# Patient Record
Sex: Male | Born: 1935 | Race: White | Hispanic: No | Marital: Married | Smoking: Never smoker
Health system: Southern US, Community
[De-identification: ages and names within clinical notes are randomized; demographics above are authoritative.]

## PROBLEM LIST (undated history)

## (undated) DIAGNOSIS — I82409 Acute embolism and thrombosis of unspecified deep veins of unspecified lower extremity: Secondary | ICD-10-CM

## (undated) DIAGNOSIS — I447 Left bundle-branch block, unspecified: Secondary | ICD-10-CM

## (undated) DIAGNOSIS — B029 Zoster without complications: Principal | ICD-10-CM

## (undated) DIAGNOSIS — K635 Polyp of colon: Secondary | ICD-10-CM

## (undated) DIAGNOSIS — I10 Essential (primary) hypertension: Secondary | ICD-10-CM

## (undated) DIAGNOSIS — Z8546 Personal history of malignant neoplasm of prostate: Secondary | ICD-10-CM

## (undated) DIAGNOSIS — J8410 Pulmonary fibrosis, unspecified: Secondary | ICD-10-CM

## (undated) DIAGNOSIS — E785 Hyperlipidemia, unspecified: Secondary | ICD-10-CM

## (undated) DIAGNOSIS — I5022 Chronic systolic (congestive) heart failure: Secondary | ICD-10-CM

## (undated) DIAGNOSIS — I8390 Asymptomatic varicose veins of unspecified lower extremity: Secondary | ICD-10-CM

## (undated) DIAGNOSIS — K5730 Diverticulosis of large intestine without perforation or abscess without bleeding: Secondary | ICD-10-CM

## (undated) DIAGNOSIS — C61 Malignant neoplasm of prostate: Secondary | ICD-10-CM

## (undated) HISTORY — DX: Zoster without complications: B02.9

## (undated) HISTORY — DX: Chronic systolic (congestive) heart failure: I50.22

## (undated) HISTORY — DX: Acute embolism and thrombosis of unspecified deep veins of unspecified lower extremity: I82.409

## (undated) HISTORY — PX: CERVICAL LAMINECTOMY: SHX94

## (undated) HISTORY — PX: APPENDECTOMY: SHX54

## (undated) HISTORY — DX: Hyperlipidemia, unspecified: E78.5

## (undated) HISTORY — DX: Essential (primary) hypertension: I10

## (undated) HISTORY — DX: Left bundle-branch block, unspecified: I44.7

## (undated) HISTORY — DX: Diverticulosis of large intestine without perforation or abscess without bleeding: K57.30

## (undated) HISTORY — PX: TOTAL HIP ARTHROPLASTY: SHX124

## (undated) HISTORY — DX: Pulmonary fibrosis, unspecified: J84.10

## (undated) HISTORY — DX: Asymptomatic varicose veins of unspecified lower extremity: I83.90

## (undated) HISTORY — DX: Malignant neoplasm of prostate: C61

## (undated) HISTORY — DX: Personal history of malignant neoplasm of prostate: Z85.46

## (undated) HISTORY — DX: Polyp of colon: K63.5

---

## 2000-06-20 ENCOUNTER — Encounter: Payer: Self-pay | Admitting: Orthopedic Surgery

## 2000-06-20 ENCOUNTER — Ambulatory Visit (HOSPITAL_COMMUNITY): Admission: RE | Admit: 2000-06-20 | Discharge: 2000-06-20 | Payer: Self-pay | Admitting: Orthopedic Surgery

## 2000-07-10 ENCOUNTER — Encounter: Payer: Self-pay | Admitting: Orthopedic Surgery

## 2000-07-14 ENCOUNTER — Inpatient Hospital Stay (HOSPITAL_COMMUNITY): Admission: RE | Admit: 2000-07-14 | Discharge: 2000-07-18 | Payer: Self-pay | Admitting: Orthopedic Surgery

## 2000-07-14 ENCOUNTER — Encounter: Payer: Self-pay | Admitting: Orthopedic Surgery

## 2002-12-22 ENCOUNTER — Encounter: Payer: Self-pay | Admitting: Internal Medicine

## 2003-11-09 ENCOUNTER — Encounter: Payer: Self-pay | Admitting: Cardiovascular Disease

## 2003-11-09 ENCOUNTER — Encounter: Payer: Self-pay | Admitting: Internal Medicine

## 2003-11-09 ENCOUNTER — Observation Stay (HOSPITAL_COMMUNITY): Admission: EM | Admit: 2003-11-09 | Discharge: 2003-11-10 | Payer: Self-pay | Admitting: Emergency Medicine

## 2003-11-11 ENCOUNTER — Encounter: Payer: Self-pay | Admitting: Internal Medicine

## 2003-12-02 ENCOUNTER — Inpatient Hospital Stay (HOSPITAL_BASED_OUTPATIENT_CLINIC_OR_DEPARTMENT_OTHER): Admission: RE | Admit: 2003-12-02 | Discharge: 2003-12-02 | Payer: Self-pay | Admitting: Cardiology

## 2004-02-24 ENCOUNTER — Ambulatory Visit: Payer: Self-pay | Admitting: Cardiovascular Disease

## 2004-03-22 ENCOUNTER — Ambulatory Visit: Payer: Self-pay | Admitting: Cardiology

## 2004-05-03 ENCOUNTER — Ambulatory Visit: Payer: Self-pay | Admitting: Cardiology

## 2004-05-22 ENCOUNTER — Ambulatory Visit: Payer: Self-pay | Admitting: Cardiology

## 2004-06-04 ENCOUNTER — Ambulatory Visit: Payer: Self-pay | Admitting: Internal Medicine

## 2004-06-05 ENCOUNTER — Observation Stay (HOSPITAL_COMMUNITY): Admission: EM | Admit: 2004-06-05 | Discharge: 2004-06-05 | Payer: Self-pay | Admitting: Emergency Medicine

## 2004-06-11 ENCOUNTER — Ambulatory Visit: Payer: Self-pay | Admitting: Cardiology

## 2004-06-12 ENCOUNTER — Ambulatory Visit: Payer: Self-pay | Admitting: Internal Medicine

## 2004-06-14 ENCOUNTER — Encounter: Admission: RE | Admit: 2004-06-14 | Discharge: 2004-06-14 | Payer: Self-pay | Admitting: Internal Medicine

## 2004-06-26 ENCOUNTER — Ambulatory Visit: Payer: Self-pay | Admitting: Cardiology

## 2004-06-26 ENCOUNTER — Encounter: Admission: RE | Admit: 2004-06-26 | Discharge: 2004-06-26 | Payer: Self-pay | Admitting: Internal Medicine

## 2004-08-16 ENCOUNTER — Ambulatory Visit: Payer: Self-pay | Admitting: Cardiology

## 2004-08-21 ENCOUNTER — Inpatient Hospital Stay (HOSPITAL_COMMUNITY): Admission: RE | Admit: 2004-08-21 | Discharge: 2004-08-22 | Payer: Self-pay | Admitting: Neurosurgery

## 2005-02-07 ENCOUNTER — Ambulatory Visit: Payer: Self-pay | Admitting: Cardiology

## 2005-03-11 ENCOUNTER — Ambulatory Visit: Payer: Self-pay | Admitting: Internal Medicine

## 2005-03-13 ENCOUNTER — Ambulatory Visit: Payer: Self-pay

## 2005-03-13 ENCOUNTER — Encounter: Payer: Self-pay | Admitting: Internal Medicine

## 2005-05-06 ENCOUNTER — Ambulatory Visit: Payer: Self-pay | Admitting: Cardiology

## 2005-05-09 ENCOUNTER — Ambulatory Visit: Payer: Self-pay | Admitting: Internal Medicine

## 2005-05-13 ENCOUNTER — Ambulatory Visit: Payer: Self-pay | Admitting: Internal Medicine

## 2005-05-27 ENCOUNTER — Ambulatory Visit: Payer: Self-pay | Admitting: Internal Medicine

## 2005-07-05 ENCOUNTER — Ambulatory Visit: Payer: Self-pay | Admitting: Internal Medicine

## 2005-08-16 ENCOUNTER — Ambulatory Visit: Payer: Self-pay | Admitting: Internal Medicine

## 2005-10-11 ENCOUNTER — Ambulatory Visit: Payer: Self-pay | Admitting: Internal Medicine

## 2005-10-29 ENCOUNTER — Ambulatory Visit: Payer: Self-pay | Admitting: *Deleted

## 2005-12-12 ENCOUNTER — Ambulatory Visit: Payer: Self-pay | Admitting: Cardiology

## 2005-12-30 ENCOUNTER — Encounter: Payer: Self-pay | Admitting: Cardiology

## 2005-12-30 ENCOUNTER — Ambulatory Visit: Payer: Self-pay

## 2006-02-28 ENCOUNTER — Ambulatory Visit: Payer: Self-pay | Admitting: Internal Medicine

## 2006-02-28 LAB — CONVERTED CEMR LAB
BUN: 36 mg/dL — ABNORMAL HIGH (ref 6–23)
CO2: 26 meq/L (ref 19–32)
Calcium: 9.3 mg/dL (ref 8.4–10.5)
Chloride: 104 meq/L (ref 96–112)
Creatinine, Ser: 1.3 mg/dL (ref 0.4–1.5)
GFR calc non Af Amer: 58 mL/min
Glomerular Filtration Rate, Af Am: 70 mL/min/{1.73_m2}
Glucose, Bld: 100 mg/dL — ABNORMAL HIGH (ref 70–99)
Potassium: 5.4 meq/L — ABNORMAL HIGH (ref 3.5–5.1)
Sodium: 136 meq/L (ref 135–145)

## 2006-03-10 ENCOUNTER — Ambulatory Visit: Payer: Self-pay | Admitting: Cardiology

## 2006-03-18 ENCOUNTER — Encounter: Admission: RE | Admit: 2006-03-18 | Discharge: 2006-06-16 | Payer: Self-pay | Admitting: Internal Medicine

## 2006-04-22 DIAGNOSIS — C61 Malignant neoplasm of prostate: Secondary | ICD-10-CM

## 2006-04-22 HISTORY — DX: Malignant neoplasm of prostate: C61

## 2006-05-26 ENCOUNTER — Ambulatory Visit: Payer: Self-pay | Admitting: Internal Medicine

## 2006-05-26 LAB — CONVERTED CEMR LAB
ALT: 40 units/L (ref 0–40)
AST: 35 units/L (ref 0–37)
Albumin: 3.9 g/dL (ref 3.5–5.2)
Alkaline Phosphatase: 59 units/L (ref 39–117)
BUN: 24 mg/dL — ABNORMAL HIGH (ref 6–23)
Basophils Absolute: 0.1 10*3/uL (ref 0.0–0.1)
Basophils Relative: 1.2 % — ABNORMAL HIGH (ref 0.0–1.0)
Bilirubin, Direct: 0.1 mg/dL (ref 0.0–0.3)
CO2: 29 meq/L (ref 19–32)
Calcium: 9.5 mg/dL (ref 8.4–10.5)
Chloride: 106 meq/L (ref 96–112)
Cholesterol: 172 mg/dL (ref 0–200)
Creatinine, Ser: 1.2 mg/dL (ref 0.4–1.5)
Direct LDL: 86.5 mg/dL
Eosinophils Absolute: 0.3 10*3/uL (ref 0.0–0.6)
Eosinophils Relative: 5.9 % — ABNORMAL HIGH (ref 0.0–5.0)
GFR calc Af Amer: 76 mL/min
GFR calc non Af Amer: 63 mL/min
Glucose, Bld: 100 mg/dL — ABNORMAL HIGH (ref 70–99)
HCT: 38.3 % — ABNORMAL LOW (ref 39.0–52.0)
HDL: 48.6 mg/dL (ref >39.0)
Hemoglobin: 13.4 g/dL (ref 13.0–17.0)
Lymphocytes Relative: 30.9 % (ref 12.0–46.0)
MCHC: 35 g/dL (ref 30.0–36.0)
MCV: 93.4 fL (ref 78.0–100.0)
Monocytes Absolute: 0.8 10*3/uL — ABNORMAL HIGH (ref 0.2–0.7)
Monocytes Relative: 13.3 % — ABNORMAL HIGH (ref 3.0–11.0)
Neutro Abs: 2.9 10*3/uL (ref 1.4–7.7)
Neutrophils Relative %: 48.7 % (ref 43.0–77.0)
PSA: 1.27 ng/mL (ref 0.10–4.00)
Platelets: 227 10*3/uL (ref 150–400)
Potassium: 4.6 meq/L (ref 3.5–5.1)
RBC: 4.1 M/uL — ABNORMAL LOW (ref 4.22–5.81)
RDW: 12.7 % (ref 11.5–14.6)
Sodium: 139 meq/L (ref 135–145)
TSH: 1.85 microintl units/mL (ref 0.35–5.50)
Total Bilirubin: 0.5 mg/dL (ref 0.3–1.2)
Total CHOL/HDL Ratio: 3.5
Total Protein: 7.3 g/dL (ref 6.0–8.3)
Triglycerides: 257 mg/dL (ref 0–149)
VLDL: 51 mg/dL — ABNORMAL HIGH (ref 0–40)
WBC: 5.9 10*3/uL (ref 4.5–10.5)

## 2006-06-10 IMAGING — CR DG CHEST 2V
2 series · 2 of 2 positions shown · non-contrast
Comparison: none

CLINICAL DATA: 73-year-old with syncopal episode. 
 CHEST ? 2 VIEW:
 Two views of the chest with prior films from 11/09/03.  The cardiac silhouette is upper limits of normal with mild left ventricular configuration.  The right hilum is quite prominent, but this is a stable finding.  The patient had a recent chest CT and looks to be a combination of prominent right pulmonary artery, SVC, and ascending aorta.  Low lung volumes with chronic interstitial change.  No edema, infiltrates, or effusions.

[view not recorded (1 of 2)]
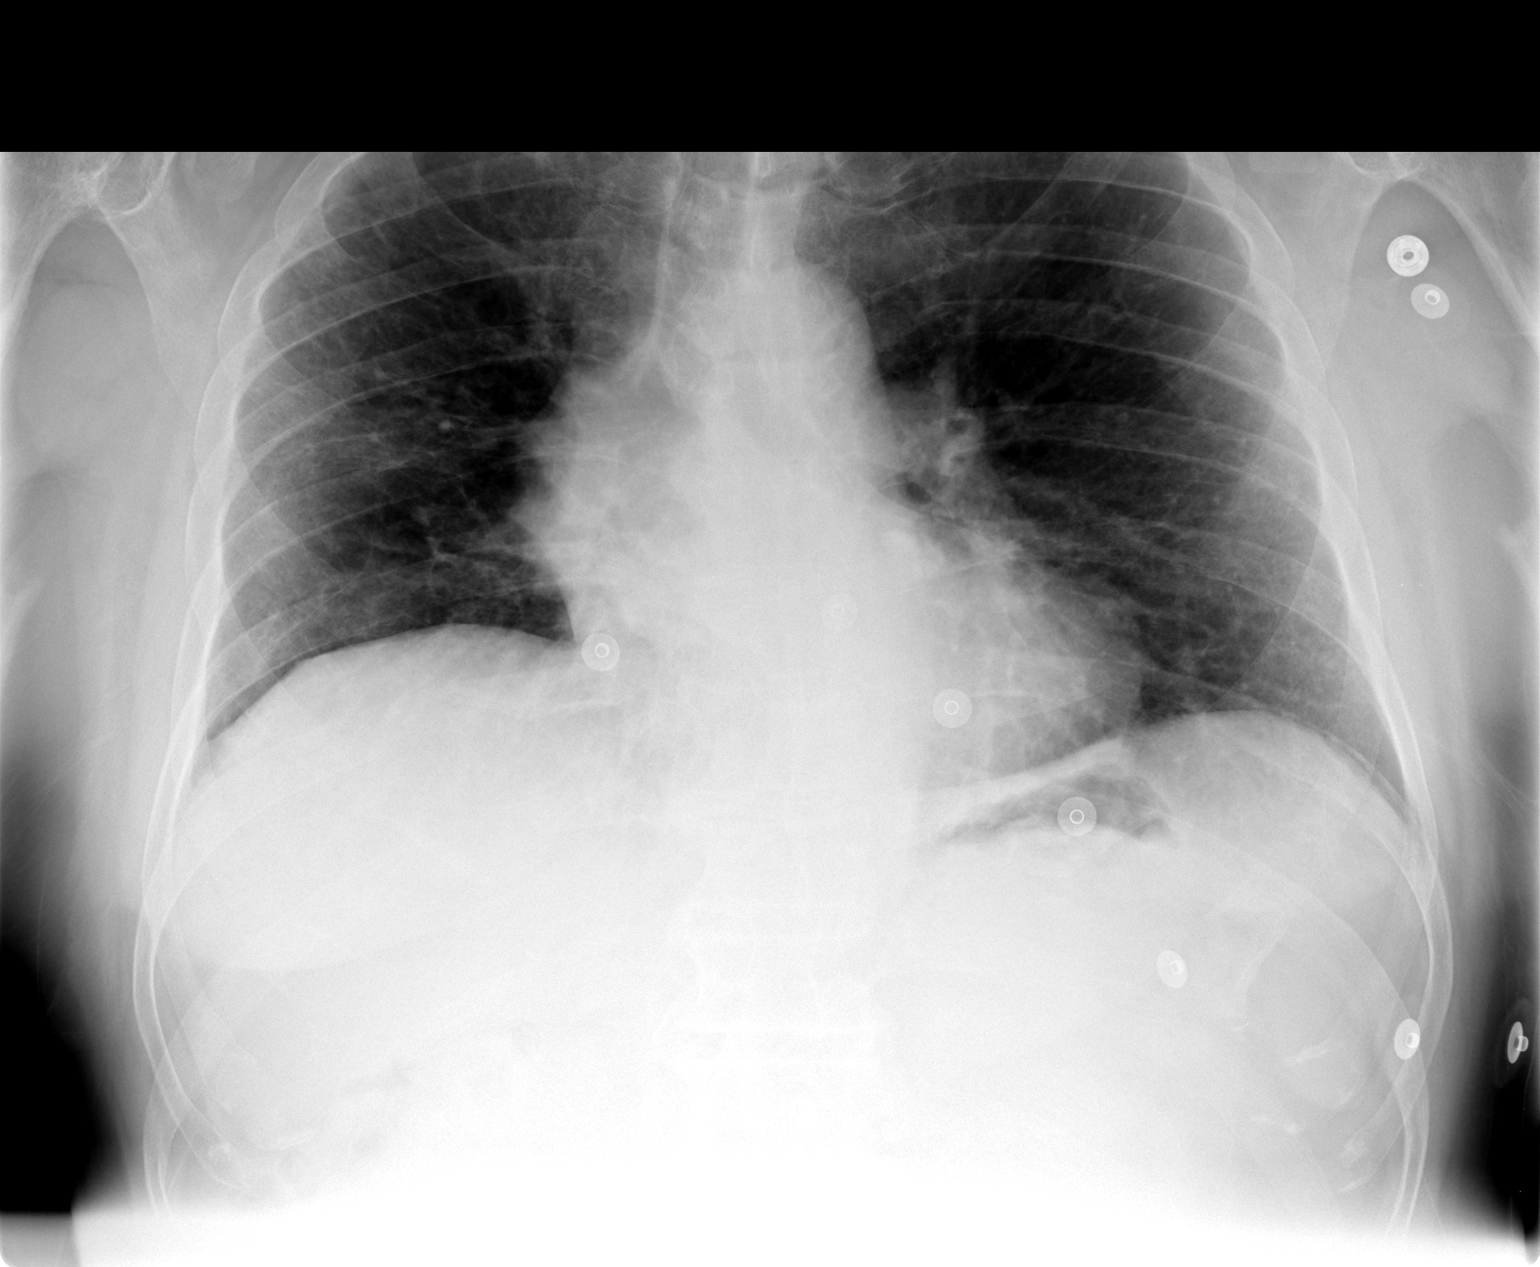

[view not recorded (2 of 2)]
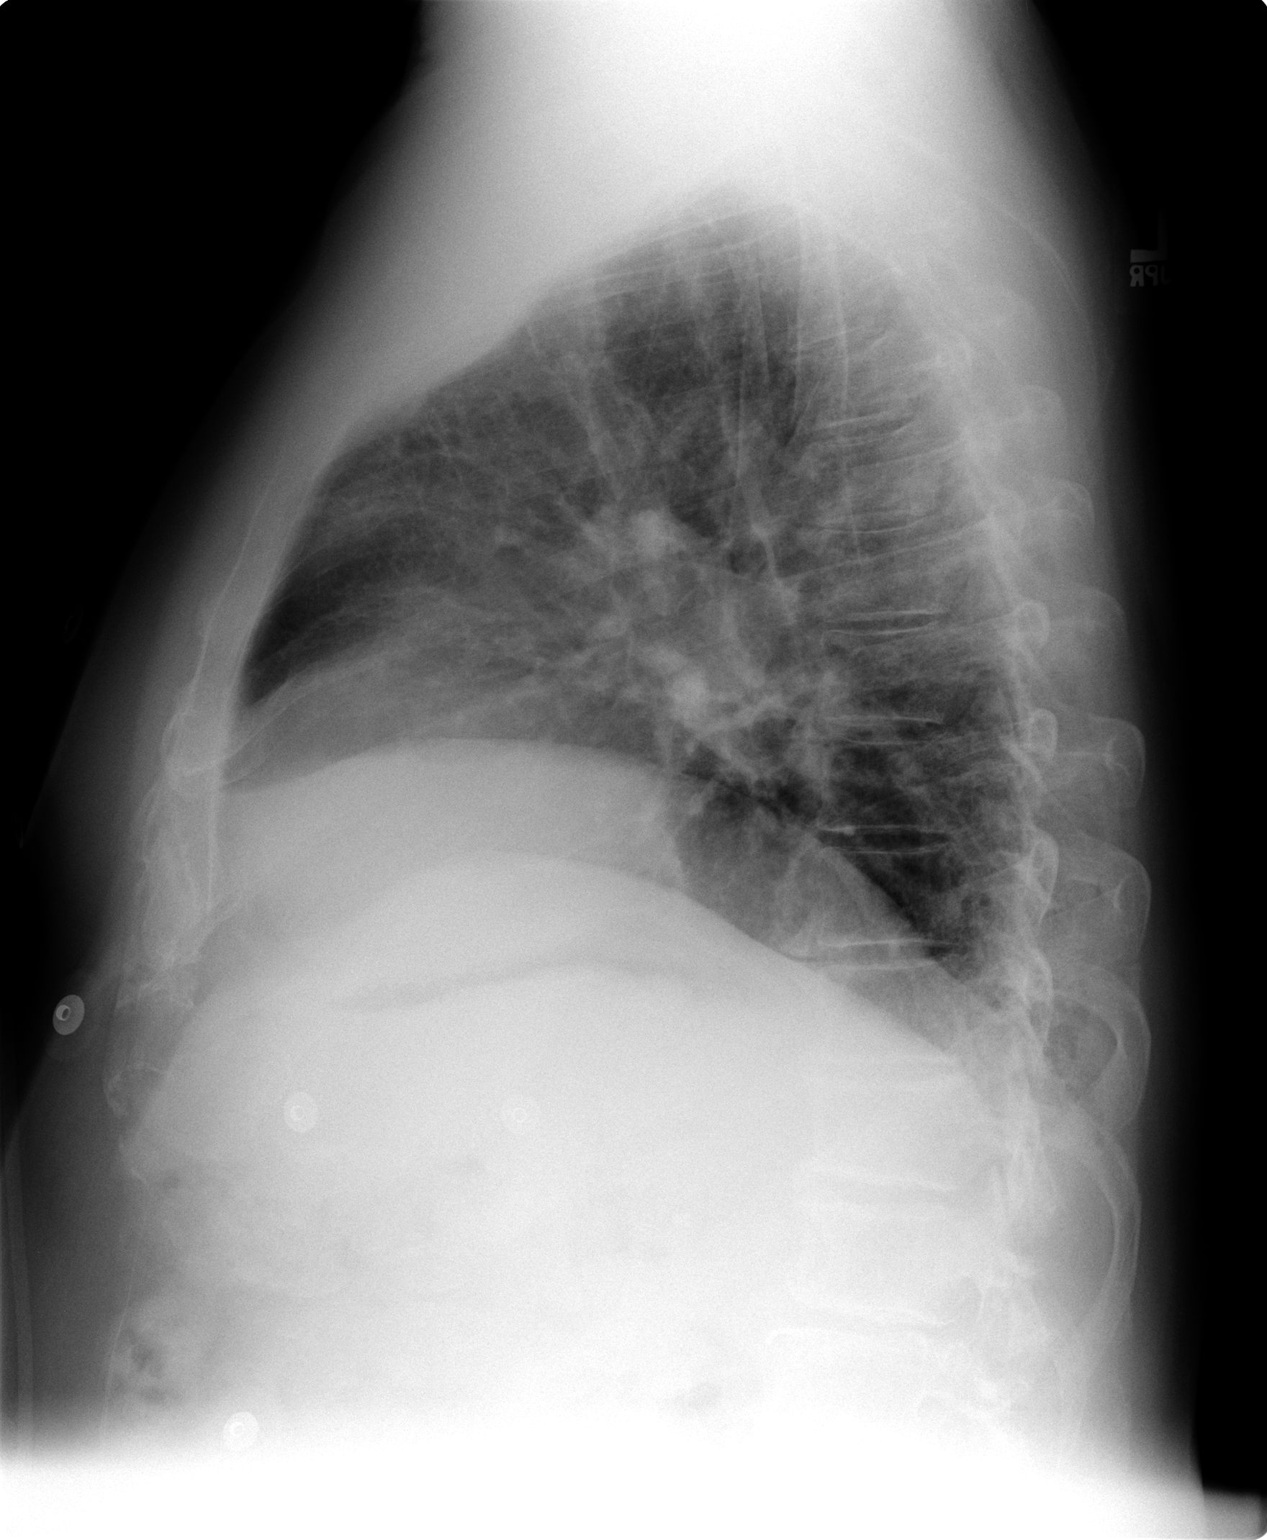

[2 of 2 positions shown; findings below may reference images not displayed]

IMPRESSION: 1.  Stable heart size and stable prominent right hilar contour as described above.  
 2.  Chronic interstitial changes, but no definite acute overlying pulmonary process.

## 2006-06-18 ENCOUNTER — Encounter: Payer: Self-pay | Admitting: Internal Medicine

## 2006-06-19 ENCOUNTER — Ambulatory Visit: Payer: Self-pay | Admitting: Internal Medicine

## 2006-07-01 ENCOUNTER — Ambulatory Visit: Payer: Self-pay | Admitting: Internal Medicine

## 2006-07-01 ENCOUNTER — Encounter: Payer: Self-pay | Admitting: Internal Medicine

## 2006-07-01 ENCOUNTER — Encounter (INDEPENDENT_AMBULATORY_CARE_PROVIDER_SITE_OTHER): Payer: Self-pay | Admitting: Specialist

## 2006-07-08 ENCOUNTER — Ambulatory Visit: Admission: RE | Admit: 2006-07-08 | Discharge: 2006-10-06 | Payer: Self-pay | Admitting: Radiation Oncology

## 2006-07-21 ENCOUNTER — Encounter: Admission: RE | Admit: 2006-07-21 | Discharge: 2006-07-21 | Payer: Self-pay | Admitting: Urology

## 2006-08-25 ENCOUNTER — Ambulatory Visit (HOSPITAL_BASED_OUTPATIENT_CLINIC_OR_DEPARTMENT_OTHER): Admission: RE | Admit: 2006-08-25 | Discharge: 2006-08-25 | Payer: Self-pay | Admitting: Urology

## 2006-09-29 ENCOUNTER — Encounter: Payer: Self-pay | Admitting: Internal Medicine

## 2006-11-14 ENCOUNTER — Encounter: Payer: Self-pay | Admitting: Internal Medicine

## 2006-12-01 ENCOUNTER — Ambulatory Visit: Payer: Self-pay | Admitting: Cardiology

## 2006-12-25 ENCOUNTER — Ambulatory Visit: Payer: Self-pay | Admitting: Internal Medicine

## 2007-02-20 ENCOUNTER — Ambulatory Visit: Payer: Self-pay | Admitting: Internal Medicine

## 2007-05-25 ENCOUNTER — Encounter: Payer: Self-pay | Admitting: Internal Medicine

## 2007-05-26 ENCOUNTER — Ambulatory Visit: Payer: Self-pay | Admitting: Internal Medicine

## 2007-05-26 DIAGNOSIS — I868 Varicose veins of other specified sites: Secondary | ICD-10-CM

## 2007-05-26 DIAGNOSIS — I447 Left bundle-branch block, unspecified: Secondary | ICD-10-CM

## 2007-05-26 DIAGNOSIS — E785 Hyperlipidemia, unspecified: Secondary | ICD-10-CM

## 2007-05-26 DIAGNOSIS — I10 Essential (primary) hypertension: Secondary | ICD-10-CM

## 2007-05-26 DIAGNOSIS — Z8546 Personal history of malignant neoplasm of prostate: Secondary | ICD-10-CM | POA: Insufficient documentation

## 2007-05-26 LAB — CONVERTED CEMR LAB
ALT: 38 units/L (ref 0–53)
Albumin: 4 g/dL (ref 3.5–5.2)
Alkaline Phosphatase: 50 units/L (ref 39–117)
BUN: 27 mg/dL — ABNORMAL HIGH (ref 6–23)
Bilirubin, Direct: 0.2 mg/dL (ref 0.0–0.3)
Calcium: 9.7 mg/dL (ref 8.4–10.5)
Cholesterol: 180 mg/dL (ref 0–200)
Eosinophils Relative: 5.4 % — ABNORMAL HIGH (ref 0.0–5.0)
Glucose, Bld: 93 mg/dL (ref 70–99)
HCT: 38.7 % — ABNORMAL LOW (ref 39.0–52.0)
Hemoglobin: 12.9 g/dL — ABNORMAL LOW (ref 13.0–17.0)
Lymphocytes Relative: 24.4 % (ref 12.0–46.0)
Neutrophils Relative %: 57.9 % (ref 43.0–77.0)
Platelets: 213 10*3/uL (ref 150–400)
RDW: 12.7 % (ref 11.5–14.6)
Sodium: 140 meq/L (ref 135–145)
Total Bilirubin: 0.8 mg/dL (ref 0.3–1.2)
Triglycerides: 238 mg/dL (ref 0–149)

## 2007-05-29 DIAGNOSIS — Z86718 Personal history of other venous thrombosis and embolism: Secondary | ICD-10-CM

## 2007-05-29 DIAGNOSIS — I509 Heart failure, unspecified: Secondary | ICD-10-CM

## 2007-07-17 DIAGNOSIS — D126 Benign neoplasm of colon, unspecified: Secondary | ICD-10-CM | POA: Insufficient documentation

## 2007-07-17 DIAGNOSIS — K5730 Diverticulosis of large intestine without perforation or abscess without bleeding: Secondary | ICD-10-CM | POA: Insufficient documentation

## 2007-08-14 ENCOUNTER — Ambulatory Visit (HOSPITAL_COMMUNITY): Admission: EM | Admit: 2007-08-14 | Discharge: 2007-08-14 | Payer: Self-pay | Admitting: Emergency Medicine

## 2007-08-25 ENCOUNTER — Encounter: Payer: Self-pay | Admitting: Internal Medicine

## 2007-09-29 ENCOUNTER — Ambulatory Visit: Payer: Self-pay | Admitting: Internal Medicine

## 2007-10-01 LAB — CONVERTED CEMR LAB
ALT: 30 units/L (ref 0–53)
AST: 29 units/L (ref 0–37)
Alkaline Phosphatase: 64 units/L (ref 39–117)
HDL: 39.2 mg/dL (ref >39.0)
Total CHOL/HDL Ratio: 7
Total Protein: 6.7 g/dL (ref 6.0–8.3)
Triglycerides: 234 mg/dL (ref 0–149)
VLDL: 47 mg/dL — ABNORMAL HIGH (ref 0–40)

## 2007-10-07 ENCOUNTER — Ambulatory Visit: Payer: Self-pay | Admitting: Internal Medicine

## 2007-12-10 ENCOUNTER — Ambulatory Visit: Payer: Self-pay | Admitting: Cardiology

## 2007-12-23 ENCOUNTER — Encounter: Payer: Self-pay | Admitting: Internal Medicine

## 2008-02-10 ENCOUNTER — Telehealth: Payer: Self-pay | Admitting: Internal Medicine

## 2008-03-23 ENCOUNTER — Inpatient Hospital Stay (HOSPITAL_COMMUNITY): Admission: RE | Admit: 2008-03-23 | Discharge: 2008-03-26 | Payer: Self-pay | Admitting: Orthopedic Surgery

## 2008-04-27 ENCOUNTER — Encounter: Payer: Self-pay | Admitting: Internal Medicine

## 2008-08-30 ENCOUNTER — Encounter: Payer: Self-pay | Admitting: Internal Medicine

## 2008-10-07 ENCOUNTER — Encounter (INDEPENDENT_AMBULATORY_CARE_PROVIDER_SITE_OTHER): Payer: Self-pay | Admitting: *Deleted

## 2008-10-31 ENCOUNTER — Ambulatory Visit: Payer: Self-pay | Admitting: Internal Medicine

## 2008-11-01 LAB — CONVERTED CEMR LAB
ALT: 38 units/L (ref 0–53)
AST: 35 units/L (ref 0–37)
Albumin: 3.7 g/dL (ref 3.5–5.2)
Alkaline Phosphatase: 49 units/L (ref 39–117)
BUN: 18 mg/dL (ref 6–23)
Basophils Absolute: 0 10*3/uL (ref 0.0–0.1)
Bilirubin, Direct: 0.1 mg/dL (ref 0.0–0.3)
Cholesterol: 166 mg/dL (ref 0–200)
GFR calc non Af Amer: 77.28 mL/min (ref >60)
Glucose, Bld: 97 mg/dL (ref 70–99)
Hemoglobin: 13.7 g/dL (ref 13.0–17.0)
LDL Cholesterol: 89 mg/dL (ref 0–99)
Lymphs Abs: 1.7 10*3/uL (ref 0.7–4.0)
MCHC: 34.8 g/dL (ref 30.0–36.0)
MCV: 94.8 fL (ref 78.0–100.0)
Monocytes Absolute: 0.8 10*3/uL (ref 0.1–1.0)
Monocytes Relative: 13.4 % — ABNORMAL HIGH (ref 3.0–12.0)
Platelets: 204 10*3/uL (ref 150.0–400.0)
Sodium: 142 meq/L (ref 135–145)
TSH: 1.81 microintl units/mL (ref 0.35–5.50)
Total Bilirubin: 0.6 mg/dL (ref 0.3–1.2)
Total Protein: 7.4 g/dL (ref 6.0–8.3)

## 2008-12-20 ENCOUNTER — Ambulatory Visit: Payer: Self-pay | Admitting: Cardiology

## 2009-02-09 ENCOUNTER — Ambulatory Visit: Payer: Self-pay | Admitting: Internal Medicine

## 2009-03-22 ENCOUNTER — Encounter (INDEPENDENT_AMBULATORY_CARE_PROVIDER_SITE_OTHER): Payer: Self-pay | Admitting: *Deleted

## 2009-08-16 ENCOUNTER — Ambulatory Visit: Payer: Self-pay | Admitting: Internal Medicine

## 2009-08-17 LAB — CONVERTED CEMR LAB
ALT: 37 units/L (ref 0–53)
AST: 37 units/L (ref 0–37)
Albumin: 3.9 g/dL (ref 3.5–5.2)
Alkaline Phosphatase: 49 units/L (ref 39–117)
Basophils Absolute: 0 10*3/uL (ref 0.0–0.1)
Bilirubin, Direct: 0 mg/dL (ref 0.0–0.3)
Calcium: 9.3 mg/dL (ref 8.4–10.5)
Eosinophils Absolute: 0.4 10*3/uL (ref 0.0–0.7)
Eosinophils Relative: 6.9 % — ABNORMAL HIGH (ref 0.0–5.0)
HDL: 58.5 mg/dL (ref >39.00)
LDL Cholesterol: 59 mg/dL (ref 0–99)
Lymphs Abs: 1.5 10*3/uL (ref 0.7–4.0)
MCHC: 34.1 g/dL (ref 30.0–36.0)
Monocytes Relative: 14.4 % — ABNORMAL HIGH (ref 3.0–12.0)
Neutro Abs: 2.5 10*3/uL (ref 1.4–7.7)
Neutrophils Relative %: 48.8 % (ref 43.0–77.0)
RBC: 4.25 M/uL (ref 4.22–5.81)
RDW: 14.3 % (ref 11.5–14.6)
Sodium: 142 meq/L (ref 135–145)
TSH: 2.36 microintl units/mL (ref 0.35–5.50)
Triglycerides: 166 mg/dL — ABNORMAL HIGH (ref 0.0–149.0)
WBC: 5.2 10*3/uL (ref 4.5–10.5)

## 2009-09-11 ENCOUNTER — Telehealth (INDEPENDENT_AMBULATORY_CARE_PROVIDER_SITE_OTHER): Payer: Self-pay | Admitting: *Deleted

## 2009-11-27 ENCOUNTER — Encounter: Payer: Self-pay | Admitting: Internal Medicine

## 2009-11-27 ENCOUNTER — Encounter: Payer: Self-pay | Admitting: Cardiology

## 2010-01-02 ENCOUNTER — Encounter: Payer: Self-pay | Admitting: Cardiology

## 2010-01-02 ENCOUNTER — Ambulatory Visit: Payer: Self-pay

## 2010-01-02 DIAGNOSIS — R930 Abnormal findings on diagnostic imaging of skull and head, not elsewhere classified: Secondary | ICD-10-CM | POA: Insufficient documentation

## 2010-01-03 ENCOUNTER — Ambulatory Visit: Payer: Self-pay | Admitting: Cardiology

## 2010-01-03 DIAGNOSIS — R0989 Other specified symptoms and signs involving the circulatory and respiratory systems: Secondary | ICD-10-CM | POA: Insufficient documentation

## 2010-01-10 ENCOUNTER — Telehealth: Payer: Self-pay | Admitting: Cardiology

## 2010-01-30 ENCOUNTER — Ambulatory Visit: Payer: Self-pay | Admitting: Internal Medicine

## 2010-05-22 NOTE — Letter (Signed)
Summary: [facility]  [facility]   Imported By: Maryln Gottron 12/06/2009 12:52:04  _____________________________________________________________________  External Attachment:    Type:   Image     Comment:   External Document

## 2010-05-22 NOTE — Progress Notes (Signed)
Summary: Test results   Phone Note Call from Patient Call back at Home Phone [phone]   Caller: Patient Reason for Call: Lab or Test Results Initial call taken by: Judie Grieve,  January 10, 2010 10:24 AM  Follow-up for Phone Call        adv pt of cxr results. Claris Gladden, RN, BSN

## 2010-05-22 NOTE — Miscellaneous (Signed)
Summary: Orders Update  CXR order  Clinical Lists Changes  Problems: Added new problem of ABNORMAL BREATH SOUNDS (ICD-786.7) Orders: Added new Test order of T-2 View CXR (71020TC) - Signed

## 2010-05-22 NOTE — Assessment & Plan Note (Signed)
Summary: FLU SHOT // RS   Nurse Visit   Allergies: 1)  ! Uroxatral (Alfuzosin Hcl) 2)  ! Diovan (Valsartan)  Orders Added: 1)  Flu Vaccine 18yrs + MEDICARE PATIENTS [Q2039] 2)  Administration Flu vaccine - MCR [G0008] Flu Vaccine Consent Questions     Do you have a history of severe allergic reactions to this vaccine? no    Any prior history of allergic reactions to egg and/or gelatin? no    Do you have a sensitivity to the preservative Thimersol? no    Do you have a past history of Guillan-Barre Syndrome? no    Do you currently have an acute febrile illness? no    Have you ever had a severe reaction to latex? no    Vaccine information given and explained to patient? yes    Are you currently pregnant? no    Lot Number:[id number]   Exp Date:10/20/2010   Site Given  Left Deltoid IM .lbmedflu

## 2010-05-22 NOTE — Progress Notes (Signed)
    Walk in Patient Form Recieved " pt needs smaller capsule" sent to [facility]  Sep 11, 2009 11:13 AM

## 2010-05-22 NOTE — Letter (Signed)
Summary: [facility]  [facility]   Imported By: Marylou Mccoy 12/14/2009 11:25:41  _____________________________________________________________________  External Attachment:    Type:   Image     Comment:   External Document

## 2010-05-22 NOTE — Assessment & Plan Note (Signed)
Summary: CPX/PT FASTING/RCD   Vital Signs:  Patient profile:   77 year old male Height:      69.75 inches Weight:      212 pounds BMI:     30.75 Pulse rate:   68 / minute Pulse rhythm:   regular Resp:     12 per minute BP sitting:   118 / 80  (left arm) Cuff size:   regular  Vitals Entered By: Gladis Riffle, RN (August 16, 2009 8:53 AM) CC: annual review of systems, fasting--discuss red marks on lower legs Is Patient Diabetic? No   Primary Care Provider:  Dr. Cato Mulligan  CC:  annual review of systems and fasting--discuss red marks on lower legs.  History of Present Illness: Here for Medicare AWV:  1.   Risk factors based on Past M, S, F history: see list 2.   Physical Activities: walks daily 3.   Depression/mood: no problems 4.   Hearing: no concerns 5.   ADL's: --able to do everything 6.   Fall Risk: none recently 7.   Home Safety: no concerns 8.   Height, weight, &visual acuity:no visual trouble 9.   Counseling: none necessary 10.   Labs ordered based on risk factors: see orders 11.           Referral Coordination: none necessary 12.           Care Plan: see A/P 13.            Cognitive Assessment --no concerns...able to carry on complicated/sophisticated conversations  Current Problems:  CONGESTIVE HEART FAILURE (ICD-428.0)--no concerns, able to exercise without difficulty VARICOSE VEIN (ICD-456.8)---no problems HYPERTENSION (ICD-401.9)---not measuring at home HYPERLIPIDEMIA (ICD-272.4)---tolerating meds  All other systems reviewed and were negative     Preventive Screening-Counseling & Management  Alcohol-Tobacco     Smoking Status: never  Current Problems (verified): 1)  Colonic Polyps, Adenomatous  (ICD-211.3) 2)  Diverticulosis, Colon  (ICD-562.10) 3)  Congestive Heart Failure  (ICD-428.0) 4)  Varicose Vein  (ICD-456.8) 5)  Family History Diabetes 1st Degree Relative  (ICD-V18.0) 6)  Lbbb  (ICD-426.3) 7)  Dvt, Hx of  (ICD-V12.51) 8)  Hypertension   (ICD-401.9) 9)  Hyperlipidemia  (ICD-272.4) 10)  Prostate Cancer, Hx of  (ICD-V10.46)  Current Medications (verified): 1)  Vytorin 10-40 Mg  Tabs (Ezetimibe-Simvastatin) .... Take 1 Tablet By Mouth At Bedtime 2)  Coreg 25 Mg  Tabs (Carvedilol) .... 1/2 By Mouth Two Times A Day 3)  Vicodin 5-500 Mg Tabs (Hydrocodone-Acetaminophen) .... One By Mouth Q 6 Hours As Needed 4)  Fish Oil   Oil (Fish Oil) .... Once Daily 5)  Co Q-10 30 Mg  Caps (Coenzyme Q10) .... Once Daily 6)  Aspirin 81 Mg Tbec (Aspirin) .... Take One Tablet By Mouth Daily  Allergies: 1)  ! Uroxatral (Alfuzosin Hcl) 2)  ! Diovan (Valsartan)  Past History:  Past Medical History: Last updated: 12/16/2008 INTERNAL HEMORRHOIDS (ICD-455.0) COLONIC POLYPS, ADENOMATOUS (ICD-211.3) DIVERTICULOSIS, COLON (ICD-562.10) CONGESTIVE HEART FAILURE (ICD-428.0) (Nonischemic EF 45%) POSTURAL LIGHTHEADEDNESS (ICD-780.4) VARICOSE VEIN (ICD-456.8) FAMILY HISTORY DIABETES 1ST DEGREE RELATIVE (ICD-V18.0) LBBB (ICD-426.3) DVT, HX OF (ICD-V12.51) HYPERTENSION (ICD-401.9) HYPERLIPIDEMIA (ICD-272.4) PROSTATE CANCER, HX OF (ICD-V10.46)  Past Surgical History: Last updated: 12/16/2008 Appendectomy Cervical laminectomy Total hip replacement-right hip has been replaced x 2.  After the second total hip arthroplasty the hip became dislocated and required intervention.  He has also had left total hip arthroplasty  Family History: Last updated: 12/16/2008 Family History Diabetes 1st degree relative.  Noncontributory with early coronary artery disease.  His  father died in his 88s to complications of diabetes.  Social History: Last updated: 12/16/2008 Retired Never Smoked Regular exercise-yes    Risk Factors: Exercise: yes (05/26/2007)  Risk Factors: Smoking Status: never (08/16/2009)  Review of Systems       All other systems reviewed and were negative   Physical Exam  General:  alert and well-developed.   Head:  normocephalic  and atraumatic.   Eyes:  pupils equal and pupils round.   Ears:  R ear normal and L ear normal.   Neck:  No deformities, masses, or tenderness noted. Chest Wall:  No deformities, masses, tenderness or gynecomastia noted. Lungs:  normal respiratory effort and no intercostal retractions.   Heart:  normal rate and regular rhythm.   Abdomen:  soft and non-tender.   Prostate:  [place] Msk:  No deformity or scoliosis noted of thoracic or lumbar spine.   Neurologic:  cranial nerves II-XII intact and gait normal.     Impression & Recommendations:  Problem # 1:  PREVENTIVE HEALTH CARE (ICD-V70.0)  Orders: First annual wellness visit with prevention plan  (T5573) Venipuncture (22025) TLB-Lipid Panel (80061-LIPID) TLB-BMP (Basic Metabolic Panel-BMET) (80048-METABOL) TLB-CBC Platelet - w/Differential (85025-CBCD) TLB-Hepatic/Liver Function Pnl (80076-HEPATIC) TLB-TSH (Thyroid Stimulating Hormone) (84443-TSH) TLB-PSA (Prostate Specific Antigen) (84153-PSA)  Problem # 2:  CONGESTIVE HEART FAILURE (ICD-428.0)  His updated medication list for this problem includes:    Coreg 25 Mg Tabs (Carvedilol) .Marland Kitchen... 1/2 by mouth two times a day    Aspirin 81 Mg Tbec (Aspirin) .Marland Kitchen... Take one tablet by mouth daily  Problem # 3:  HYPERTENSION (ICD-401.9)  His updated medication list for this problem includes:    Coreg 25 Mg Tabs (Carvedilol) .Marland Kitchen... 1/2 by mouth two times a day  Problem # 4:  HYPERLIPIDEMIA (ICD-272.4)  His updated medication list for this problem includes:    Vytorin 10-40 Mg Tabs (Ezetimibe-simvastatin) .Marland Kitchen... Take 1 tablet by mouth at bedtime  Problem # 5:  PROSTATE CANCER, HX OF (ICD-V10.46) f/u urology  Complete Medication List: 1)  Vytorin 10-40 Mg Tabs (Ezetimibe-simvastatin) .... Take 1 tablet by mouth at bedtime 2)  Coreg 25 Mg Tabs (Carvedilol) .... 1/2 by mouth two times a day 3)  Vicodin 5-500 Mg Tabs (Hydrocodone-acetaminophen) .... One by mouth q 6 hours as needed 4)   Fish Oil Oil (Fish oil) .... Once daily 5)  Co Q-10 30 Mg Caps (Coenzyme q10) .... Once daily 6)  Aspirin 81 Mg Tbec (Aspirin) .... Take one tablet by mouth daily  Appended Document: Orders Update Medications Added TRIAMCINOLONE ACETONIDE 0.5 % CREA (TRIAMCINOLONE ACETONIDE) apply bid to affected area          Clinical Lists Changes  Medications: Added new medication of TRIAMCINOLONE ACETONIDE 0.5 % CREA (TRIAMCINOLONE ACETONIDE) apply bid to affected area - Signed Rx of TRIAMCINOLONE ACETONIDE 0.5 % CREA (TRIAMCINOLONE ACETONIDE) apply bid to affected area;  #15 grams x 1;  Signed;  Entered by: Birdie Sons MD;  Authorized by: Birdie Sons MD;  Method used: Electronically to [facility] [address] [phone]*, [address] [place], [place], [place]  [address], Ph: [phone] or [phone], Fax: [phone] Orders: Added new Service order of UA Dipstick w/o Micro (automated)  (81003) - Signed    Prescriptions: TRIAMCINOLONE ACETONIDE 0.5 % CREA (TRIAMCINOLONE ACETONIDE) apply bid to affected area  #15 grams x 1   Entered and Authorized by:   Birdie Sons MD   Signed by:  Birdie Sons MD on 08/16/2009   Method used:   Electronically to        [facility]* (retail)       [address]       [place], [place]  [address]       Ph: [phone] or [phone]       Fax: [phone]   RxID:   [id number]    Appended Document: Orders Update     Clinical Lists Changes  Observations: Added new observation of COMMENTS: Wynona Canes, CMA  August 16, 2009 11:32 AM  (08/16/2009 11:32) Added new observation of PH URINE: 5.5  (08/16/2009 11:32) Added new observation of SPEC GR URIN: 1.015  (08/16/2009 11:32) Added new observation of APPEARANCE U: Clear  (08/16/2009 11:32) Added new observation of UA COLOR: yellow  (08/16/2009 11:32) Added new observation of WBC DIPSTK U: negative  (08/16/2009 11:32) Added new observation of NITRITE URN:  negative  (08/16/2009 11:32) Added new observation of UROBILINOGEN: 0.2  (08/16/2009 11:32) Added new observation of PROTEIN, URN: trace  (08/16/2009 11:32) Added new observation of BLOOD UR DIP: negative  (08/16/2009 11:32) Added new observation of KETONES URN: negative  (08/16/2009 11:32) Added new observation of BILIRUBIN UR: negative  (08/16/2009 11:32) Added new observation of GLUCOSE, URN: negative  (08/16/2009 11:32)      Laboratory Results   Urine Tests  Date/Time Recieved: August 16, 2009 11:32 AM  Date/Time Reported: August 16, 2009 11:32 AM   Routine Urinalysis   Color: yellow Appearance: Clear Glucose: negative   (Normal Range: Negative) Bilirubin: negative   (Normal Range: Negative) Ketone: negative   (Normal Range: Negative) Spec. Gravity: 1.015   (Normal Range: 1.003-1.035) Blood: negative   (Normal Range: Negative) pH: 5.5   (Normal Range: 5.0-8.0) Protein: trace   (Normal Range: Negative) Urobilinogen: 0.2   (Normal Range: 0-1) Nitrite: negative   (Normal Range: Negative) Leukocyte Esterace: negative   (Normal Range: Negative)    Comments: Wynona Canes, CMA  August 16, 2009 11:32 AM

## 2010-05-22 NOTE — Miscellaneous (Signed)
Summary: Orders Update-CXR order  Clinical Lists Changes  Orders: Added new Test order of T-2 View CXR (71020TC) - Signed 

## 2010-05-22 NOTE — Progress Notes (Signed)
Summary: ? carvedilol (smaller dose and does he need a refill?)  Medications Added CARVEDILOL 12.5 MG TABS (CARVEDILOL) one twice a day       Phone Note Outgoing Call   Call placed by: Charolotte Capuchin, RN,  Sep 11, 2009 3:38 PM Call placed to: Patient Details for Reason: Carvedilol refill Summary of Call: pt left a note at the front desk wanting to know if "there is a a smaller capsule".  Attempted to reach pt by phone and left a voice mail to call back about  the request.  Pt is on carvedilol 25 mg 1/2 twice a day and it doesn't come in capsules,to my knowledge. Initial call taken by: Charolotte Capuchin, RN,  Sep 11, 2009 3:41 PM  Follow-up for Phone Call        Pt calling back about his presription Carvedilol Judie Grieve  Sep 12, 2009 9:59 AM SPoke with pt's wife, she is unsure of what the pt is needing and will have him call back.  Sander Nephew, RN  spoke with pt who needs an RX for carvedilol 12.5 mg tablets one twice a day #180 X 3 to be sent to [facility] Follow-up by: Charolotte Capuchin, RN,  Sep 14, 2009 9:23 AM    New/Updated Medications: CARVEDILOL 12.5 MG TABS (CARVEDILOL) one twice a day Prescriptions: CARVEDILOL 12.5 MG TABS (CARVEDILOL) one twice a day  #180 x 3   Entered by:   Charolotte Capuchin, RN   Authorized by:   Rollene Rotunda, MD, [facility]   Signed by:   Charolotte Capuchin, RN on 09/14/2009   Method used:   Faxed to ...       [facility] Environmental education officer)       [address] [address]       [place], [place]  [address]       Ph: [phone]       Fax: [phone]   RxID:   [id number] CARVEDILOL 12.5 MG TABS (CARVEDILOL) one twice a day  #180 x 3   Entered by:   Charolotte Capuchin, RN   Authorized by:   Rollene Rotunda, MD, [facility]   Signed by:   Charolotte Capuchin, RN on 09/14/2009   Method used:   Electronically to        [facility]* (retail)       [address]       [place], [place]  [address]      Ph: [phone] or [phone]       Fax: [phone]   RxID:   [id number]

## 2010-05-22 NOTE — Assessment & Plan Note (Signed)
Summary: 1 yr rov      Allergies Added:   Visit Type:  Follow-up Primary Provider:  Dr. Cato Mulligan  CC:  Cardiomyopathy.  History of Present Illness: The patient presents for followup of his nonischemic cardiomyopathy. Since I last saw him he has done well by his report although he did describe a little dyspnea recently while in [place]. He said he had it quite a bit of walking up and down hills at that time and would become fatigued with this. Because of this he joined a gym and is trying to exercise. He is working with a Engineer, manufacturing. He has had some mild lower extremity edema. He is not describing chest pressure, neck or arm discomfort. He is not describing palpitations, presyncope or syncope. He is not describing PND or orthopnea. He does watch his salt. He has taken medications as listed.  Current Medications (verified): 1)  Vytorin 10-40 Mg  Tabs (Ezetimibe-Simvastatin) .... Take 1 Tablet By Mouth At Bedtime 2)  Carvedilol 12.5 Mg Tabs (Carvedilol) .... One Twice A Day 3)  Fish Oil   Oil (Fish Oil) .... Once Daily 4)  Co Q-10 30 Mg  Caps (Coenzyme Q10) .... Once Daily  Allergies (verified): 1)  ! Uroxatral (Alfuzosin Hcl) 2)  ! Diovan (Valsartan)  Past History:  Past Medical History: Reviewed history from 12/16/2008 and no changes required. INTERNAL HEMORRHOIDS (ICD-455.0) COLONIC POLYPS, ADENOMATOUS (ICD-211.3) DIVERTICULOSIS, COLON (ICD-562.10) CONGESTIVE HEART FAILURE (ICD-428.0) (Nonischemic EF 45%) POSTURAL LIGHTHEADEDNESS (ICD-780.4) VARICOSE VEIN (ICD-456.8) FAMILY HISTORY DIABETES 1ST DEGREE RELATIVE (ICD-V18.0) LBBB (ICD-426.3) DVT, HX OF (ICD-V12.51) HYPERTENSION (ICD-401.9) HYPERLIPIDEMIA (ICD-272.4) PROSTATE CANCER, HX OF (ICD-V10.46)  Past Surgical History: Reviewed history from 12/16/2008 and no changes required. Appendectomy Cervical laminectomy Total hip replacement-right hip has been replaced x 2.  After the second total hip arthroplasty the hip became  dislocated and required intervention.  He has also had left total hip arthroplasty  Review of Systems       As stated in the HPI and negative for all other systems.   Vital Signs:  Patient profile:   77 year old male Height:      69.75 inches Pulse rate:   58 / minute BP sitting:   140 / 80  (right arm)  Vitals Entered By: Laurance Flatten CMA (January 02, 2010 5:02 PM)  Physical Exam  General:  Well developed, well nourished, in no acute distress. Head:  normocephalic and atraumatic Eyes:  PERRLA/EOM intact; conjunctiva and lids normal. Mouth:  Teeth, gums and palate normal. Oral mucosa normal. Neck:  Neck supple, no JVD. No masses, thyromegaly or abnormal cervical nodes. Chest Wall:  no deformities or breast masses noted Lungs:  Bilateral right greater than left basilar crackles one third the way up on the right, no wheezing, no dullness to percussion Abdomen:  Bowel sounds positive; abdomen soft and non-tender without masses, organomegaly, or hernias noted. No hepatosplenomegaly, obese Msk:  Back normal, normal gait. Muscle strength and tone normal. Extremities:  No clubbing or cyanosis. Neurologic:  Alert and oriented x 3. Skin:  Intact without lesions or rashes. Cervical Nodes:  no significant adenopathy Axillary Nodes:  no significant adenopathy Inguinal Nodes:  no significant adenopathy Psych:  Normal affect.   Detailed Cardiovascular Exam  Neck    Carotids: Carotids full and equal bilaterally without bruits.      Neck Veins: Normal, no JVD.    Heart    Inspection: no deformities or lifts noted.      Palpation: normal  PMI with no thrills palpable.      Auscultation: regular rate and rhythm, S1, S2 without murmurs, rubs, gallops, or clicks.    Vascular    Abdominal Aorta: no palpable masses, pulsations, or audible bruits.      Femoral Pulses: normal femoral pulses bilaterally.      Pedal Pulses: normal pedal pulses bilaterally.      Radial Pulses: normal radial  pulses bilaterally.      Peripheral Circulation: no clubbing, cyanosis.  Mild lower extremity edema   EKG  Procedure date:  01/02/2010  Findings:      Sinus rhythm, rate 58, left bundle branch block, left axis deviation  Impression & Recommendations:  Problem # 1:  CONGESTIVE HEART FAILURE (ICD-428.0) He is having no overt symptoms and had a mildly reduced ejection fraction previously. At this point I do not think repeat echocardiography is indicated but will continue to manage him medically.  Problem # 2:  NONSPCIFC ABN FINDING RAD & OTH EXAM LUNG FIELD (ICD-793.1) I am concerned about the significant crackles I hear on exam today. I tried to question him to make sure he's not minimizing symptoms. I don't think he is but I will check PA and lateral chest x-ray to evaluate this finding.  Problem # 3:  HYPERTENSION (ICD-401.9) His blood pressure is controlled. He will continue the meds as listed. Orders: EKG w/ Interpretation (93000)  Problem # 4:  HYPERLIPIDEMIA (ICD-272.4) I reviewed his most recent lipid profile and it was excellent.  Patient Instructions: 1)  Your physician recommends that you schedule a follow-up appointment in: 12 months with Dr Antoine Poche 2)  Your physician recommends that you continue on your current medications as directed. Please refer to the Current Medication list given to you today. 3)  A chest x-ray takes a picture of the organs and structures inside the chest, including the heart, lungs, and blood vessels. This test can show several things, including, whether the heart is enlarged; whether fluid is building up in the lungs; and whether pacemaker / defibrillator leads are still in place.

## 2010-09-04 NOTE — Assessment & Plan Note (Signed)
[facility]                         GASTROENTEROLOGY OFFICE NOTE   NAME:BIEHLBoaz, Berisha                        MRN:          [id number]  DATE:12/25/2006                            DOB:          January 30, 1931    REFERRING PHYSICIAN:  Bertram Millard. Dahlstedt, M.D.   REASON FOR CONSULTATION:  Rectal bleeding.   HISTORY:  This is a 77 year old, white male with a history of  nonischemic cardiomyopathy, dyslipidemia, deep venous thrombosis, and  prostate cancer for which he underwent radioactive seed implantation in  May 2008. I saw the patient July 01, 2006 when he underwent complete  colonoscopy. He was found to have diverticulosis and a diminutive colon  polyp as well as internal hemorrhoids. Followup in 5 years recommended.  The patient was in his usual state of health until about 4 weeks ago  when he began to notice daily rectal bleeding as manifested by bright  red blood in the toilet water and on the tissue. There was no associated  abdominal discomfort or rectal discomfort. The past 2 days he has had no  bleeding. He is now referred.   PAST MEDICAL HISTORY:  As above.   PAST SURGICAL HISTORY:  1. Appendectomy.  2. Hip replacement.  3. Radioactive seed implantation for prostate cancer.   ALLERGIES:  No known drug allergies.   CURRENT MEDICATIONS:  Vytorin, Coreg, Diovan, and Flomax.   FAMILY HISTORY:  Negative for gastrointestinal malignancy.   SOCIAL HISTORY:  The patient is married with 2 children, he lives with  his wife, Engineer, maintenance (IT), retired from [employer]. He does not  smoke, will use alcohol.   REVIEW OF SYSTEMS:  Per diagnostic evaluation form.   PHYSICAL EXAMINATION:  GENERAL:  Well-appearing male in no acute  distress.  VITAL SIGNS:  Blood pressure is 96/64, heart rate 60, weight is 220.8  pounds. He is 5 feet 11 inches in height.  HEENT:  Sclera are anicteric, conjunctiva are pink.  LUNGS:  Clear.  HEART:  Regular.  ABDOMEN:  Soft without tenderness, mass or hernia.  RECTAL:  Reveals no external abnormalities, no internal mass or  tenderness.  ANOSCOPY:  Anoscopic examination reveals inflamed internal hemorrhoids.  The rectal mucosa was not visible.   IMPRESSION:  Intermittent problems with rectal bleeding almost certainly  due to visualized internal hemorrhoids.   RECOMMENDATIONS:  1. Daily fiber supplementation with Metamucil.  2. Anusol suppositories at night as directed.  3. Brochure provided on hemorrhoids and hemorrhoids care.  4. GI followup p.r.n.     Wilhemina Bonito. Marina Goodell, MD  Electronically Signed    JNP/MedQ  DD: 12/25/2006  DT: 12/25/2006  Job #: [id number]   cc:   Bertram Millard. Dahlstedt, M.D.  Bruce Rexene Edison Swords, MD

## 2010-09-04 NOTE — Op Note (Signed)
NAMEARCHIBALD, MARCHETTA NO.:  [id number]   MEDICAL RECORD NO.:  [id number]          PATIENT TYPE:  INP   LOCATION:  0098                         FACILITY:  [facility]   PHYSICIAN:  Alvy Beal, MD    DATE OF BIRTH:  [birth year]   DATE OF PROCEDURE:  08/14/2007  DATE OF DISCHARGE:                               OPERATIVE REPORT   PREOPERATIVE DIAGNOSIS:  Right total hip prosthetic dislocation.   POSTOPERATIVE DIAGNOSIS:  Right total hip prosthetic dislocation.   OPERATIVE PROCEDURE:  Closed reduction of right hip dislocation.   COMPLICATIONS:  None.   CONDITION:  Stable.   HISTORY:  This is a very pleasant 77 year old gentleman who was seen in  the ER this morning with a hip dislocation.  After discussing the  treatment options, the decision was made to take him to the operating  room for closed reduction under general anesthesia.  All appropriate  risks, benefits and alternatives were discussed.  Consent was obtained.   OPERATIVE NOTE:  The patient was brought to the operating room and left  on the stretcher.  After successful induction of a  mask anesthesia, a  gentle closed reduction maneuver consisting of external rotation and  traction allowed the hip to reduce without significant trauma.  The leg  lengths were equal.  I was able to internally and externally rotate the  leg and flex the hip without any further instability.  A knee  immobilizer was placed, and intraoperative plain x-rays confirmed  satisfactory reduction of the hip dislocation.  This was both in the AP  and lateral planes.   With the hip reduced, the patient was awakened and once satisfactorily  breathing, was transferred to the PACU without incident.  He remained  neurovascularly intact.      Alvy Beal, MD  Electronically Signed     DDB/MEDQ  D:  08/14/2007  T:  08/14/2007  Job:  [id number]

## 2010-09-04 NOTE — H&P (Signed)
NAMESARON, TWEED NO.:  [id number]   MEDICAL RECORD NO.:  [id number]          PATIENT TYPE:  INP   LOCATION:  0098                         FACILITY:  [facility]   PHYSICIAN:  Alvy Beal, MD    DATE OF BIRTH:  January 30, 1931   DATE OF ADMISSION:  08/14/2007  DATE OF DISCHARGE:                              HISTORY & PHYSICAL   ADMISSION DIAGNOSIS:  Right hip dislocation.   HISTORY:  This is a very pleasant 77 year old gentleman who is a patient  of Dr. Lequita Halt who underwent a total hip replacement with revision in  the past.  The patient has had two previous right total hip prostatic  dislocations and his last one on that right side being approximately 7  years ago.  The patient states that he was at home cleaning allergy out  of his pool when he just simply turned a little bit, felt a pop and was  unable to stand or ambulate from that point on.  His leg was noted to be  shortened and rotated.  He was brought to the emergency room here at  [facility] where images were taken.  He was then diagnosed with a  superior dislocation of the hip and ortho consultation was requested.   Upon arrival in the emergency room the patient was relatively  comfortable.  He was resting in bed with complaints of just right hip  pain.   PAST MEDICAL, SURGICAL, FAMILY HISTORY AND SOCIAL HISTORY:  He has had  prostate cancer and subsequent surgery.  He has an irregular heart rate.  He had bilateral hip replacements, the last one on the right about 12  years ago, with two previous dislocations, the last dislocation about 7  years ago.  He is a nonsmoker, occasional drinker.  He lives at home.  He has no known drug allergies.  He is currently taking Vytorin,  Carbitol.   REVIEW OF SYSTEMS:  His 14-point review of systems is significant for  some history of coronary artery disease, cancer, but he is otherwise  healthy with no significant pertinent positives on review of systems.   PHYSICAL EXAM:  GENERAL APPEARANCE:  He is a pleasant gentleman who  appears his stated age, in no acute distress.  He is alert and oriented  x3.  NEUROLOGICAL:  He has intact distal neurovascular exam with EHL,  tibialis anterior and gastrocnemius all 5/5, intact peripheral pulses  bilaterally.  The dorsalis pedis and posterior tibialis.  No ankle no  knee pain with palpation or with gentle range of motion.  Has  significant right groin pain and an obviously shortened externally  rotated extremity.  RESPIRATORY: He has no shortness of breath or chest pain.  VITAL SIGNS:  He is afebrile with stable vital signs.   At this point time I discussed treatment options with the patient and  his family.  X-rays of the right lower extremity demonstrate a superior  dislocation with no distal femoral or periprosthetic fracture.   At this point in time I discussed treatment options with the family. We  elected  to proceed with a closed reduction under anesthesia which will  allow an easier reduction that would be less traumatic.  I discussed the  risks which could be fracture, inability to reduce, and recurrent  dislocation, as well as the risks of anesthesia which include death,  stroke, paralysis.   The plan will be to take the patient to the operating room. If it is a  successful closed reduction and he is doing well, then I will discharge  him later on today to home, and he will follow up next week with  weightbearing daughter with a knee immobilizer and he follows up with  Dr. Lequita Halt, his original treating surgeon.      Alvy Beal, MD  Electronically Signed     DDB/MEDQ  D:  08/14/2007  T:  08/14/2007  Job:  [id number]   cc:   Alvy Beal, MD  Fax: [phone]

## 2010-09-04 NOTE — Assessment & Plan Note (Signed)
[facility]                            CARDIOLOGY OFFICE NOTE   NAME:[name] [name], [name] [name]                        MRN:          [id number]  DATE:12/01/2006                            DOB:          Aug 12, 1930    PRIMARY:  Dr. Birdie Sons.   REASON FOR PRESENTATION:  Evaluate patient with cardiomyopathy.   HISTORY OF PRESENT ILLNESS:  Patient returns for followup of the above.  Last year he had an echocardiogram which suggested his ejection fraction  was approximately 45%.  This was slightly better than it had been  previously.  He continues on the medications as listed below.  From a  cardiovascular standpoint he has had no issues since I last saw him.  He  has had no new shortness of breath and denies any PND or orthopnea.  He  has had no palpitations, presyncope or syncope.  He denies any chest  pain.  He has had radiation seed implants for newly diagnosed prostate  cancer.  He does have some chronic dyspnea with exertion but relates  this to his weight and inactivity.   PAST MEDICAL HISTORY:  1. Nonischemic cardiomyopathy (EF approximately 45%).  2. History of a deep venous thrombosis.  3. Dyslipidemia.   ALLERGIES:  NONE.   MEDICATIONS:  1. Vytorin 10/40.  2. Coreg 25 mg b.i.d.  3. Diovan HCT 160/12.5.  4. Flomax.   REVIEW OF SYSTEMS:  As stated in the HPI and otherwise negative for  other systems.   PHYSICAL EXAMINATION:  Patient is in no distress.  Blood pressure  108/63, heart rate 70 and regular.  HEENT:  Eyelids unremarkable, pupils equally round and reactive to  light, fundi not visualized, oral mucosa unremarkable.  NECK:  No jugular venous distention at 45 degrees, carotid upstroke  brisk and symmetrical, no bruits, no thyromegaly.  LYMPHATICS:  No adenopathy.  LUNGS:  Clear to auscultation bilaterally.  BACK:  No costovertebral angle tenderness.  CHEST:  Unremarkable.  HEART:  PMI not displaced or sustained, S1 and S2 within  normal limits,  no S3, no S4, no clicks, no rubs, no murmurs.  ABDOMEN:  Obese, positive bowel sounds normal in frequency and pitch, no  bruits, no rebound, no guarding, no midline pulsatile mass, no  hepatomegaly, splenomegaly.  SKIN:  No rashes, no nodules.  EXTREMITIES:  With 2+ pulses throughout, no edema, no cyanosis, no  clubbing.  NEURO:  Oriented to person, place, and time; cranial nerves II-XII  grossly intact, motor grossly intact.   EKG sinus rhythm, left bundle branch block, left axis deviation, no  acute ST-T wave changes.   ASSESSMENT/PLAN:  1. Cardiomyopathy.  Patient has a mildly reduced ejection fraction.      He has no new symptoms.  No further cardiovascular testing is      suggested.  At this point he will continue with medical management      for this.  He does need increased exercise and weight loss and he      will work on this.  If his dyspnea does not get better with  progressive weight loss and exercise training then we could      consider further evaluation.  2. Obesity.  We discussed the [employer] Diet.  3. Followup.  We will see him back in 1 year or sooner if needed.     Rollene Rotunda, MD, [facility]  Electronically Signed    JH/MedQ  DD: 12/01/2006  DT: 12/02/2006  Job #: [id number]   cc:   Valetta Mole. Swords, MD

## 2010-09-04 NOTE — Op Note (Signed)
[name] [name], [name] [name] NO.:  [id number]   MEDICAL RECORD NO.:  [id number]          PATIENT TYPE:  INP   LOCATION:  0010                         FACILITY:  [facility]   PHYSICIAN:  Ollen Gross, M.D.    DATE OF BIRTH:  [birth year]   DATE OF PROCEDURE:  03/23/2008  DATE OF DISCHARGE:                               OPERATIVE REPORT   PREOPERATIVE DIAGNOSIS:  Unstable right total hip arthroplasty.   POSTOPERATIVE DIAGNOSIS:  Unstable right total hip arthroplasty.   PROCEDURE:  Right acetabular revision.   SURGEON:  Ollen Gross, M.D.   ASSISTANT:  Alexzandrew L. Perkins, P.A.C.   ANESTHESIA:  General.   ESTIMATED BLOOD LOSS:  150.   DRAIN:  Hemovac X1.   COMPLICATIONS:  None.   CONDITION:  Stable to recovery.   BRIEF CLINICAL NOTE:  [name] [name] is a 77 year old male who had a right total  hip arthroplasty done many years ago and a revision done about 10 or 11  years ago.  He recently has developed instability of the hip.  He had a  cemented all-polyethylene acetabular component and my concern is that  this has loosened or shifted.  He presents now for revision for  instability.   PROCEDURE IN DETAIL:  After the successful administration of general  anesthetic, the patient is placed in the left lateral decubitus position  with the right side up and held with the hip positioner.  Right lower  extremity is isolated from his perineum with plastic drapes and prepped  and draped in the usual sterile fashion.  A standard posterolateral  incision is made with a 10 blade through subcutaneous tissue to the  level of fascia lata, which is incised in line with the skin incision.  The sciatic nerve is palpated and protected and the posterior  pseudocapsule then excised off the femur.  The hip is easily dislocated  at flexion at 70 and internal rotation of 30.  The femoral head is  removed.  The femoral component has good anteversion and is well-fixed.  We translated the femur  anteriorly to gain acetabular exposure.  The  position of the cemented all-polyethylene cup appears to be slightly  retroverted.  The cup was easily removed and it apparently is loose.  I  removed it without any bone loss at all.  I then removed the cement,  which had also cracked in multiple places and was easily removed.  There  was no evidence of any acetabular defect.  We thoroughly irrigated the  acetabulum to make sure all small pieces of cement were removed.  Once  irrigated, I began reaming starting at 55 mm, coursing in increments of  2 to 59 mm.  A 60-mm pinnacle acetabular shell with gription is then  impacted into the acetabulum in anatomic position with excellent  purchase.  I transfixed it with two additional domed screws with  excellent purchase.  We then placed a trial 36-mm neutral +4 liner.  A  36 +3 trial head is placed.  This reduces too easily.  I went to a 36 6+  trial  head, which had more appropriate soft tissue tension.  By placing  the right leg on top of the left, I felt as though the leg was a little  short so I went to the 36 +9 head, which had fantastic soft tissue  tension and stability.  He had full extension, full external rotation,  70 degrees flexion, 40 degrees adduction and 90 degrees of internal  rotation, and then 90 degrees of flexion and nearly 90 degrees of  internal rotation.  The hip was then dislocated and trials were removed.  The permanent 36 mm neutral +4 Marathon liner was then placed in the  acetabular shell and then the permanent 36 +0 femoral head is placed  onto the SROM femoral component.  Hip is then reduced with the same  stability parameters.  It was copiously irrigated with saline solution  and the posterior pseudocapsule reattached to the femur through drill  holes.  Fascia lata was closed over a Hemovac drain with interrupted #1  Vicryl, subcu closed #1 and #2-0 Vicryl and skin with staples.  Drain is  hooked to suction, incision  cleaned and dried, and a bulky sterile  dressing applied.  He is then placed into a knee immobilizer, awakened  and transported to recovery in stable condition.      Ollen Gross, M.D.  Electronically Signed     FA/MEDQ  D:  03/23/2008  T:  03/23/2008  Job:  [id number]

## 2010-09-04 NOTE — Assessment & Plan Note (Signed)
[facility]                            CARDIOLOGY OFFICE NOTE   NAME:[name] [name], Boliver                        MRN:          [id number]  DATE:12/10/2007                            DOB:          1930-06-09    PRIMARY CARE PHYSICIAN:  Birdie Sons, M.D.   REASON FOR PRESENTATION:  Evaluate the patient with cardiomyopathy.   HISTORY OF PRESENT ILLNESS:  The patient returns for yearly followup.  Since last year, he has done quite well.  He has had been having  problems with his hip going out of socket and he may need to have this  surgically repaired again.  He has been on a diet and has actually lost  14 pounds, since they last saw him!  He is down to weight that has not  previously been recorded in this chart.  He denies any shortness of  breath.  He has been active doing his bicycle and treadmill.  He denies  any resting shortness of breath, PND, or orthopnea.  He has no chest  discomfort, neck, or arm discomfort.  He has no palpitation, presyncope,  or syncope.   PAST MEDICAL HISTORY:  Nonischemic cardiomyopathy (EF approximately  45%), history of deep venous thrombosis, hypertension, and dyslipidemia.   ALLERGIES:  None.   MEDICATIONS:  1. Vytorin 10/40 daily.  2. Coreg 25 mg b.i.d.  3. Diovan 100/12.5 daily.   REVIEW OF SYSTEMS:  As stated in the HPI and otherwise negative for  other systems.   PHYSICAL EXAMINATION:  GENERAL:  The patient is in no distress.  VITAL SIGNS;  Blood pressure 121/73, heart rate 63 and regular, weight  207 pounds, body mass index 30.  HEENT:  Eyelids unremarkable, pupils equal, round and reactive to light,  fundi not visualized, oral mucosa unremarkable.  NECK:  No jugular venous distention at 45 degrees, carotid upstroke  brisk and symmetrical.  No bruits, no thyromegaly.  LYMPHATICS:  No cervical, axillary, or inguinal adenopathy.  LUNGS:  Clear to auscultation bilaterally.  BACK:  No costovertebral angle  tenderness.  CHEST:  Unremarkable.  HEART:  PMI not displaced or sustained, S1 and S2 within normal limits,  no S3, no S4, no clicks, no rubs, no murmurs.  ABDOMEN:  Obese, positive  bowel sounds, normal in frequency and pitch, no bruits, no rebound, no  guarding, no midline pulsatile mass, no hepatomegaly, no splenomegaly.  SKIN:  No rashes, no nodules.  EXTREMITIES:  2+ pulse, no edema.   EKG sinus rhythm, rate 63, left bundle-branch block, left axis  deviation, no acute ST-T wave changes, no change from previous EKGs.   ASSESSMENT AND PLAN:  1. Cardiomyopathy.  The patient has nonischemic cardiomyopathy.  His      ejection fraction,  I would suspect is not lower than previous.  I      would have no reason to image this again.  He is on an excellent      medical regimen with class I symptoms.  He will continue this      therapy.  2. Dislocated hips.  The  patient may need surgery.  If he has this      relatively soon, he would be at acceptable risk for surgery based      on ACC/AHA guidelines.  3. Hypertension.  Blood pressure is well-controlled and he will      continue the medicines as listed.  4. Obesity.  The patient's body mass index is down.  I greatly applaud      his weight loss and encouraged little bit more of the same.  5. Followup.  We will see him in 1-year or sooner if needed.     Rollene Rotunda, MD, [facility]  Electronically Signed    JH/MedQ  DD: 12/10/2007  DT: 12/11/2007  Job #: [id number]   cc:   Valetta Mole. Swords, MD

## 2010-09-07 NOTE — Op Note (Signed)
[facility]  Patient:    [name], [name]                        MRN: [id number] Proc. Date: 07/14/00 Adm. Date:  [id number] Attending:  Ollen Gross V                           Operative Report  PREOPERATIVE DIAGNOSIS:  Osteoarthritis of the left hip.  POSTOPERATIVE DIAGNOSIS:  Osteoarthritis of the left hip.  OPERATION: Left total hip arthroplasty.  SURGEON:  Ollen Gross, M.D.  ASSISTANT:  Druscilla Brownie. Shela Nevin, P.A.  ANESTHESIA:  General.  ESTIMATED BLOOD LOSS: 250 cc.  DRAIN:  Hemovac x 1.  COMPLICATIONS:  None.  CONDITION:  Stable to recovery room.  INDICATIONS:  [name] [name] is a 77 year old male who has had a rapid onset of significant left hip pain and degenerative changes.  He has had pain which has been refractory to nonoperative management.  He had temporary relief with an injection, but the pain has recurred.  He presents now for left total hip arthroplasty.  He has previously had successful right total hip and right hip revision.  DESCRIPTION OF PROCEDURE:  After successful administration of general anesthetic, the patient was placed in the right lateral decubitus position with left side upheld in hip positioner.  The left lower extremity was isolated from his perineum with plastic drapes and prepped and draped in the usual sterile fashion.  Standard posterolateral incision made.  Skin cut with #10 blade through subcutaneous tissue to level of fascia lata which is incised in line with skin incision.  Short external rotators are isolated off the femur and capsulectomy performed.  The hip is then dislocated, the center of the femoral head marked, and trial prosthesis is placed such that the center of the trial head corresponds with the center of his native femoral head. Osteotomy is then made with an oscillating saw.  The femur is retracted anteriorly, anterior capsule removed, and acetabular exposure obtained.  Acetabular osteophytes  removed.  A tremendous amount of inflammatory tissue is present in the acetabulum with significant tearing of the labrum.  This is all cleaned, and then acetabular reaming initiated with a 47.  It coursed up in increments of 2 to a 53, and then a 54 mm Pinnacle acetabular shell is impacted matching his native anteversion.  The cup is then transfixed with two domed screws.  Trial 28 mm neutral plus 4 liner is placed.  The femur is then prepared first with the axial reamers up to 15.5 mm. Proximal reaming is performed up to a 33F, and the sleeve is then machined to a large.  A 33F large trial sleeve is placed.  A 20 x 15 stem with a 36 plus 8 neck is placed matching native anteversion.  Trial 28 plus 0 head is first placed.  Initial soft tissue laxity is such that thus a 28 plus 6 head is placed.  Hip is reduced, and there is excellent stability, full extension, full external rotation, and 70 degrees flexion, 40 degrees adduction, and approximately 70 degrees internal rotation at which point he started to sublux.  He was impinging on some anterior soft tissue which was removed.  He also had 90 degrees of flexion and about 60 to 70 degrees of internal rotation.  At this point, the trials are removed, and an apex hole eliminator is placed into the acetabular shell.  The permanent 28 mm plus 4 Marathon liner is impacted into the acetabular shell.  The permanent 14F large sleeve is then impacted, and it is noted that there is a small crack k in the calcar region.  The 20 x 15 stem is then placed, and it is very stable.  It is decided for prophylactic measures to place a cable just inferior to the less trochanter to prevent propagation of the calcar split.  The cable is then passed and tightened.  This is a Dall-Miles cable.  The hip is then reduced, and excellent stability is found similar to the trial.  The wound is copiously irrigated with antibiotic solution, and short external rotator is  reattached to the femur through drill holes.  Fascia lata and fascia over the gluteus maximus are closed over a Hemovac drain with interrupted #1 Vicryl. Subcutaneous tissue is closed in two layers with interrupted #1 and interrupted 2-0 Vicryl and subcuticular closed with a running 4-0 Monocryl. The incision is clean and dry, and Steri-Strips and bulky sterile dressing applied.  The patient is then awakened and transported to recovery in stable condition. DD:  07/14/00 TD:  07/14/00 Job: [id number]

## 2010-09-07 NOTE — H&P (Signed)
NAME:  [name] [name], CARELOCK NO.:  [id number]   MEDICAL RECORD NO.:  [id number]          PATIENT TYPE:  EMS   LOCATION:  MAJO                         FACILITY:  [facility]   PHYSICIAN:  Rosalyn Gess. Norins, M.D. [facility] OF BIRTH:  11/28/1930   DATE OF ADMISSION:  06/04/2004  DATE OF DISCHARGE:                                HISTORY & PHYSICAL   CHIEF COMPLAINT:  Fall.   HISTORY OF PRESENT ILLNESS:  [name] [name] is a 77 year old Caucasian gentleman  who reports after a normal day that he had a martini on the way home from  work and he had two glasses of wine when he got home. He had not yet eaten  supper when he got up to go the door and felt himself falling down and fell  down. He remembers falling. He had no clear cut loss of consciousness. He  had no incontinence of bowel or bladder. He had no postictal symptoms. He  was not observed to be unconscious by any family members. He did get  assistance standing secondary to a history of bilateral hip replacements and  some difficulty with awkwardness. The patient reports that he felt unsteady  and fell again, again denying any loss of consciousness. At this point, EMS  was called and the patient was transported to [facility]  Emergency Room.   He arrived in the emergency department just before 8 o'clock and I evaluated  him just before midnight. During that four hour period, he remained  asymptomatic.   PAST SURGICAL HISTORY:  Bilateral hip replacements.   PAST MEDICAL HISTORY:  1.  Usual childhood diseases.  2.  He has a history of left bundle branch block.  3.  He was evaluated for chest pain with a negative evaluation.  4.  He did have cardiac catheterization which showed nonobstructive coronary      artery disease.  5.  He does have a known mild cardiomyopathy with an ejection fraction of      40%.  6.  He has been followed by Dr. Rollene Rotunda in the [facility].      Last office note was  May 22, 2004.  7.  History of GERD.  8.  History of hyperlipidemia.  9.  He has a remote history of DVT.   MEDICATIONS:  1.  Vytorin 10/40 mg 1 q.d.  2.  Altace 5 mg b.i.d.  3.  Coreg 12.5 mg b.i.d.   HABITS:  Tobacco:  None. Alcohol:  One to two ounces per day.   ALLERGIES:  The patient has no known drug allergies.   FAMILY HISTORY:  Noncontributory.   SOCIAL HISTORY:  The patient is married. He has been [employer] for 27  years. He continues to work full-time and he enjoys his wife. He has a  supportive family.   REVIEW OF SYMPTOMS:  Negative for any constitutional, cardiovascular,  respiratory, GI, or GU complaints except as per the HPI.   PHYSICAL EXAMINATION:  VITAL SIGNS:  Temperature 97.5, blood pressure was  93/60 at 2015 hours, 126/76 at  2330. Heart rate was 64 at 2015 hours, 80 at  2330 hours. O2 saturation 95%.  GENERAL:  A well-developed, well-nourished muscular Caucasian male looking  younger than his stated chronologic age.  HEENT:  Normocephalic and atraumatic. EAC's and TMs unremarkable.  Conjunctivae and sclerae were clear. Pupils are equal, round, and reactive.  NECK:  Supple.  LYMPH NODES:  No adenopathy was noted in the cervical or supraclavicular.  CHEST:  No CVA tenderness.  LUNGS:  Clear to auscultation and percussion.  CARDIOVASCULAR:  There is 2+ radial.  PULSES:  No JVD. No carotid bruits. His precordium is quiet. He had a  regular rate and rhythm. I appreciated no murmurs, gallops, or rubs.  ABDOMEN:  Soft. No guarding or rebound. Positive bowel sounds are noted.  GENITOURINARY:  Deferred.  RECTAL:  Deferred. Stool was heme negative.  NEUROLOGICAL:  The patient is awake and alert to person, place, time, and  context. He has normal cognition, normal memory and recall. Cranial nerves  II through XII are grossly intact with normal facial symmetry and movement.  Extraocular muscles were intact. Pupils are equal, round, and reactive to  light and  accommodation. Normal shoulder shrug. No deviation of the tongue  or uvula. Motor strength was 5/5 and equal throughout. DTRs are equal and  symmetrical. Patellar tendons, radial tendons, and cerebellar function was  unremarkable. The patient is able to sit comfortably on the side of the bed  with no balance problems. He has no tremor.   LABORATORY DATA:  CT of the brain without contrast was negative. Chest x-ray  was clear. The patient had a normal sinus rhythm on telemetry.   Hemoglobin 12.8 gm, white count was 7100 with 66% segs, 23% lymphs, 90%  monos. Chemistries were normal with a sodium of 135, potassium 4.1, chloride  105, CO2 23, BUN 9, creatinine 1.1, glucose 113. LFTs were normal. Point of  care cardiac enzymes were negative x 3.   ASSESSMENT/PLAN:  Fall: The patient without true syncope but probable near  syncope of a vasovagal nature. This is most likely secondary to a  combination of three ounces of alcohol (one martini and two glasses of  wine), plus Coreg, plus an empty stomach, plus the end of a long day. The  patient's labs and imaging studies were negative. Telemetry is normal.   Overnight observation for any cardiac arrhythmia. If he remains  asymptomatic, would be a good candidate for discharging home tomorrow.      MEN/MEDQ  D:  06/05/2004  T:  06/05/2004  Job:  [id number]   cc:   Valetta Mole. Swords, M.D. [facility]   Rollene Rotunda, M.D.

## 2010-09-07 NOTE — Discharge Summary (Signed)
[facility]  Patient:    [name] [name], [name] [name]                        MRN: [id number] Adm. Date:  [id number] Disc. Date: [id number] Attending:  Loanne Drilling Dictator:   Druscilla Brownie Underwood III, P.A.C. CC:         Bruce H. Swords, M.D. [facility]   Discharge Summary  OPERATION:  On 07/14/00, the patient underwent left total hip replacement arthroplasty.  D.L. Cherlynn June.C. assisted.  ADMISSION DIAGNOSES: 1. Osteoarthritis of the left hip. 2. Hypercholesterolemia. 3. Status post hip replacement/revision.  DISCHARGE DIAGNOSES: 1. Osteoarthritis of the left hip. 2. Hypercholesterolemia. 3. Status post hip replacement/revision. 4. Mild postoperative anemia.  HISTORY OF PRESENT ILLNESS:  This 77 year old male with progressive problems concerning his left hip with progressive pain.  He has had to have pain medications as well as limitation in his activities.  He has developed a contracture of 90 to 110 degrees of abduction.  His pain is reproduced by all hip range of motion.  X-rays shows severe degeneration.  It is felt that this patient would benefit with the above procedure and is admitted for same.  HOSPITAL COURSE:  The patient tolerated the surgical procedure quite well. Was placed into the total hip protocol which he did quite well.  He was very eager to progress with his level of activities.  Wound remained dried. Neurovascular remained intact to the operative extremity.  The patient was placed on Coumadin protocol postoperatively for the prevention of DVT, and was continued so throughout his hospitalization.  He also had incentive spirometer.  He did develop some mild expected anemia postoperatively, but no symptoms.  It was felt he could be maintained in his home environment with the care of his family and home physical therapy, and arrangements were made for discharge.  Dr. Lequita Halt saw the patient on the day of discharge and gave him instructions for  home.  LABORATORY DATA:  Normal CBC other than a very mild anemia with a RBC of 4.20, hematocrit was 38.4.  Final hemoglobin was 9.9 with a hematocrit of 29.2. Blood chemistries all were normal.  Urinalysis negative for urinary tract infection.  Chest x-ray showed no active disease, stable prominence along the right infrahilar region abutting the right heart border.  Electrocardiogram showed sinus rhythm.  CONDITION ON DISCHARGE:  Improved and stable.  PLAN:  The patient is discharged to his home in the care of his family.  FOLLOWUP:  He is to return to see Dr. Lequita Halt on either April 9 or April 11.  DISCHARGE MEDICATIONS:  Recommended per Dr. Lequita Halt: 1. Percocet. 2. Robaxin. 3. Restoril. 4. Coumadin protocol for 4 weeks after date of surgery. 5. Continue with his outpatient medications.  Follow up with Dr. Birdie Sons per his recommendation. DD:  07/18/00 TD:  07/19/00 Job: [id number]

## 2010-09-07 NOTE — Assessment & Plan Note (Signed)
[facility]                              CARDIOLOGY OFFICE NOTE   NAME:[name] [name]                        MRN:          [id number]  DATE:12/12/2005                            DOB:          1930-11-12    PRIMARY:  Valetta Mole. Swords, MD   REASON FOR PRESENTATION:  Patient with cardiomyopathy.   HISTORY OF PRESENT ILLNESS:  The patient returns for followup.  Since I last  saw him he has seen Dr. Cato [name] and Dr. Sherene [name].  He is felt to have some  crackles and potentially mild pulmonary fibrosis which is being followed by  Dr. Sherene [name].  He was switched off his ACE inhibitor with apparent symptomatic  improvement with less cough.  He is tolerating Diovan.  He has no new  shortness of breath.  Denies any PND or orthopnea.  He has had no  palpitation.  No presyncope or syncope.   PAST MEDICAL HISTORY:  1. Nonischemic cardiomyopathy, EF approximately 40%.  2. History of deep venous thrombosis.  3. Dyslipidemia.   ALLERGIES:  None.   MEDICATIONS:  1. Vytorin 10/40 q.day.  2. Diovan 160/12.5 q.day.  3. Coreg 25 mg b.i.d.   REVIEW OF SYSTEMS:  As stated in the HPI otherwise negative for all other  systems.   PHYSICAL EXAMINATION:  GENERAL:  The patient is in no distress.  VITAL SIGNS:  Blood pressure 120/80.  Heart rate 59 and regular.  HEENT:  Eyes: Unremarkable.  Pupils equal round and reactive to light.  Fundi not visualized.  NECK:  No jugular venous distention at 45 degrees, carotid upstroke brisk  and symmetric without bruits.  LUNGS:  Clear to auscultation bilaterally.  HEART:  PMI not displaced or sustained.  S1 and S2 within normal limits.  No  S3, no S4, no murmurs.  ABDOMEN:  Obese.  Positive bowel sounds.  Normal in frequency.  No bruits,  no rebound, no guarding.  Normal midline pulse.  No masses or organomegaly.  SKIN:  No rashes, no nodules.  EXTREMITIES:  Pulses 2+, trace edema.  NEURO:  Grossly intact.   EKG showed left bundle branch  block.   ASSESSMENT AND PLAN:  1. Cardiomyopathy.  I am going to get an echocardiogram now that he is      titrated on his meds.  I think he is on a good regimen.  I will not      make any change to this.  I want to see what his new baseline ejection      fraction is.  2. Hypertension.  Blood pressure is well controlled.  Continue on the      medications as listed.  3. Followup.  I will see the patient back in 12 months or sooner if      needed.                               Rollene Rotunda, MD, [facility]    JH/MedQ  DD:  12/12/2005  DT:  12/12/2005  Job #:  [id number]   cc:   Valetta Mole. Swords, MD

## 2010-09-07 NOTE — Consult Note (Signed)
NAME:  [name] [name], [name] [name]                           ACCOUNT NO.:  [id number]   MEDICAL RECORD NO.:  [id number]                   PATIENT TYPE:  INP   LOCATION:  0347                                 FACILITY:  [facility]   PHYSICIAN:  Rollene Rotunda, M.D.                DATE OF BIRTH:  [birth year]   DATE OF CONSULTATION:  11/09/2003  DATE OF DISCHARGE:                                   CONSULTATION   REQUESTING PHYSICIAN:  Dr. Felicity Coyer   PRIMARY CARE PHYSICIAN:  Dr. Cato Mulligan   REASON FOR PRESENTATION:  Evaluate patient with chest pain and left bundle  branch block on EKG.   HISTORY OF PRESENT ILLNESS:  The patient is a very pleasant 77 year old  gentleman without prior cardiac history.  He said that over the weekend he  was working outside.  On Monday night he had some substernal discomfort.  He  felt like he wanted to burp and that it was like heartburn.  He had this  persistent.  Tuesday it was constant all day.  He felt somewhat achy  everywhere.  At its worse it was 5/10.  He has not had symptoms like this  before.  It did not radiate.  He did not sleep well.  He said he did think  it was a little bit better sitting up.  On Monday morning he called his  primary care office and was referred to the emergency room.  He is currently  having slight discomfort and, again, feels better when he sits up and has it  feel a little bit worse when he moves.  He is denying any radiation into his  neck or to his arms.  He has had no associated nausea, vomiting,  diaphoresis.  He has had no palpitations, presyncope, or syncope.   Of note, the patient is active and exercises almost daily without symptoms.  He was noted to have a left bundle branch block and is not sure how long he  has had this (he did have an exercise treadmill test a couple of years ago  in the office and so obviously did not have a left bundle branch block  then).   ALLERGIES:  None.   MEDICATIONS:  Vytorin.   PAST MEDICAL  HISTORY:  1. Hyperlipidemia for years.  2. He has no history of hypertension or diabetes.   PAST SURGICAL HISTORY:  Multiple hip surgeries, bilateral.   SOCIAL HISTORY:  The patient is married.  Has two children and two  grandchildren.  He has never smoked and does not drink alcohol.  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   FAMILY HISTORY:  Noncontributory with early coronary artery disease.  His  father died in his 11s to complications of diabetes.   REVIEW OF SYSTEMS:  As stated in the HPI and otherwise negative for other  systems.   PHYSICAL EXAMINATION:  GENERAL:  The  patient is in no distress.  VITAL SIGNS:  Blood pressure 140s/80s, heart rate 80s and regular.  HEENT:  Eyelids unremarkable.  Pupils are equal, round, and reactive to  light.  Fundi not visualized.  Oral mucosa unremarkable.  NECK:  No jugular venous distention.  Wave form within normal limits.  Carotid upstroke brisk and symmetric.  No bruits.  No thyromegaly.  LYMPHATICS:  No cervical, axillary, or inguinal adenopathy.  LUNGS:  Clear to auscultation bilaterally.  BACK:  No costovertebral angle tenderness.  CHEST:  Unremarkable.  HEART:  PMI not displaced or sustained.  S1 and S2 within normal limits.  No  S3.  No S4.  A 2/6 brief, early peaking systolic murmur radiating at the  aortic outflow tract.  No diastolic murmurs.  ABDOMEN:  Flat.  Positive bowel sounds.  Normal in frequency and pitch.  No  bruits, rebound, guarding, midline pulsatile mass, hepatomegaly,  splenomegaly.  SKIN:  No rashes.  No nodules.  EXTREMITIES:  2+ pulses throughout.  No edema.  No cyanosis.  No clubbing.  NEUROLOGIC:  Oriented to person, place, and time.  Cranial nerves II-XII  grossly intact.  Motor grossly intact.   LABORATORIES:  EKG:  Left bundle branch block, left axis deviation.   CK-MB, troponin, and myoglobin negative x2.  Other laboratories pending.   Chest x-ray:  Question right hilar prominence.  Chest CT  results pending.   ASSESSMENT/PLAN:  1. Chest pain.  The patient's chest pain is somewhat atypical.  Does have a     history of a previous deep venous thrombosis.  I think pulmonary embolus     is unlikely but he has had a CT to rule this out.  Provided this is     negative, he can be discharged and we have arranged a stress perfusion     study in our office.  2. Left bundle branch block.  This is nonspecific.  It can be associated     with coronary disease in about 50% of cases.  Again, will screen for this     with a stress perfusion study, understanding that there is a false     positive rate even with the adenosine.  The patient should be discharged     with sublingual nitroglycerin.  Instructed to come back to the emergency     room should he have any increase in his symptoms or change.  3. Dyslipidemia, per his primary care team.                                               Rollene Rotunda, M.D.    JH/MEDQ  D:  11/09/2003  T:  11/09/2003  Job:  [id number]   cc:   Valetta Mole. Swords, M.D. [facility]

## 2010-09-07 NOTE — H&P (Signed)
NAMECLEMMIE, MARXEN                 ACCOUNT NO.:  [id number]   MEDICAL RECORD NO.:  [id number]          PATIENT TYPE:  INP   LOCATION:  NA                           [facility]ACILITY:  [facility]   PHYSICIAN:  Ollen Gross, M.D.    DATE OF BIRTH:  Aug 09, 1930   DATE OF ADMISSION:  03/23/2008  DATE OF DISCHARGE:                              HISTORY & PHYSICAL   CHIEF COMPLAINT:  Right hip instability.   HISTORY OF PRESENT ILLNESS:  The patient is a 77 year old male known to  Dr. Lequita Halt who has previously undergone right hip surgery.  Unfortunately he had several dislocations of the right hip; second one  occurred a couple of months ago.  He had revision of the hip done about  10 years ago and was symptom free up until recently.  It has become more  unstable.  X-rays show that he has a femoral component and good position  well-fixed.  His cemented all polyethylene acetabular component has a  fair amount of wear likely causing him to dislocate.  It is felt he  would benefit from undergoing revision.  Risk and benefits have been  discussed, he would like to proceed with surgery.   ALLERGIES:  None.   CURRENT MEDICATIONS:  Vytorin and Coreg.   PAST MEDICAL HISTORY:  1. History of nonischemic cardiomyopathy.  2. Prior documented ejection fraction of around 40%.  3. History of DVT.  4. Hypertension.  5. Dyslipidemia.  6. Obesity.  7. Chronic left bundle-branch block.  8. Gastroesophageal reflux disease.  9. Prior history of some mild coronary arterial disease (25% of the      LAD, 25% of the RCA, 25% of the osteo per cardiac cath report back      in August 2005).   PAST SURGICAL HISTORY:  1. He has had bilateral hip replacements and then the right hip has      been revised once.  2. He has had 3 neck surgeries.  3. Prostate seeding procedure and an appendectomy.   FAMILY HISTORY:  Noncontributory.   SOCIAL HISTORY:  He is married.  Retired.  Nonsmoker.  Apparently 1-2  glasses of wine per  evening.  Two children.  Lives in a 1-story home  with 5 steps entering.   REVIEW OF SYSTEMS:  GENERAL:  No fevers, chills, night sweats.  NEURO:  No seizures, syncope, paralysis.  RESPIRATORY:  No shortness of breath,  productive cough or hemoptysis.  CARDIOVASCULAR:  Some ischemic  cardiomyopathy and some mild coronary arterial disease but denies any  chest pain, angina or orthopnea.  GI:  No nausea, vomiting, diarrhea,  constipation.  GU:  No dysuria, hematuria, discharge.  MUSCULOSKELETAL:  Right hip.   PHYSICAL EXAMINATION:  VITAL SIGNS:  Pulse 64.  Respiration 12.  Blood  pressure 130/76.  GENERAL:  A 77 year old white male, well-nourished, well-developed,  slightly overweight, no acute distress.  He was alert, oriented,  cooperative.  Good historian.  HEENT:  Normocephalic, atraumatic.  Pupils are equal, round and  reactive.  Oropharynx clear.  EOMs are intact.  NECK:  Supple.  CHEST:  Clear.  HEART:  Regular rate and rhythm without murmur, S1, S2 noted.  ABDOMEN:  Soft, nontender.  Bowel sounds are present.  RECTAL/BREASTS/GENITALIA:  Not done, not pertinent to present illness.  EXTREMITIES:  Right hip flexion of 100 degrees, internal rotation 20,  external rotation 20, abduction about 30.  Pain free passive range of  motion.   IMPRESSION:  Unstable/instability right total hip.   PLAN:  Patient admitted to [facility], undergo a right hip  revision, acetabular revision.  Surgery is to be performed by Dr. Ollen Gross.      Alexzandrew L. Perkins, P.A.C.      Ollen Gross, M.D.  Electronically Signed    ALP/MEDQ  D:  03/21/2008  T:  03/21/2008  Job:  [id number]   cc:   Ollen Gross, M.D.  Fax: [phone]   Valetta Mole. Swords, MD  [address] [place]  [place] [address]   Rollene Rotunda, MD, [facility]  [address]  [address]  [place]  [place] [address]   Bertram Millard. Dahlstedt, M.D.  Fax: [phone]

## 2010-09-07 NOTE — H&P (Signed)
NAMEROCCO, KERKHOFF NO.:  [id number]   MEDICAL RECORD NO.:  [id number]          PATIENT TYPE:  INP   LOCATION:  2899                         FACILITY:  [facility]   PHYSICIAN:  Hilda Lias, M.D.   DATE OF BIRTH:  05/31/1930   DATE OF ADMISSION:  08/21/2004  DATE OF DISCHARGE:                                HISTORY & PHYSICAL   Mr. Garrabrant is a gentleman who came to see me in my office with his wife.  Incidentally, I did surgery on his wife many years ago. He had been  complaining of pain in the left shoulder for several months which is,  according to him, __________ pain. His wife has noted that he has been  having some difficulty with his balance and he has fallen several times  because of incoordination. He had hip replacement and he felt that the  problem was coming from the hip. The left side MRI was obtained and sent to  [place] for evaluation. Because of the findings he wants to proceed with surgery.   PAST MEDICAL HISTORY:  Bilateral hip replacement. He is not allergic to any  medications.   SOCIAL HISTORY:  He does not smoke. He drinks socially.   FAMILY HISTORY:  There is a history of diabetes in his family.   REVIEW OF SYSTEMS:  Positive for arthritis, back pain, neck pain, and  swelling of the feet.   PHYSICAL EXAMINATION:  HEENT: Normal.  NECK: He is able to flex by extensor but __________ causes discomfort.  LUNGS: Clear.  HEART: The heart size is normal.  ABDOMEN: Normal.  EXTREMITIES: There is a scar on both hips from previous surgery.  NEUROLOGIC: Mental status normal. Cranial nerves normal. Strength--I can  break easily both biceps, both wrist extensors, and both triceps. He has  also weakness in the thenar and hypothenar muscles. In the lower extremities  I cannot find any weakness. Reflexes are symmetrical, 1+ in the upper and 2+  in the lower extremities. Sensation is normal. On coordination, the patient  had difficulty walking a straight  line.   MRI showed that he has a spondylosis of L3-4, but over 5-6 and 6-7 he has  cervical stenosis with a large herniated disk at the level of C5-6 and C6-7  on the right side.   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 Spondylosis of L3-4 mostly going to the  left side.   RECOMMENDATIONS:  I talked to him and his wife at length. This gentleman has  a severe case of stenosis at the level of 5-6 and 6-7 which might explain  the weakness and the difficulty with his balance. The cervical spine x-ray  shows a spondylosis at the level of 3-4, 5-6, and 6-7. The procedure would  be a two-level anterior cervical diskectomy at the level of 5-6 and 6-7. He  knows about the risks of surgery such as infection, CSF leak, worsening of  pain, paralysis, need for further surgery which might require posterior  foraminotomy at the level of  C3-4.      EB/MEDQ  D:  08/21/2004  T:  08/21/2004  Job:  [id number]

## 2010-09-07 NOTE — Discharge Summary (Signed)
NAMEWYMON, SWANEY                 ACCOUNT NO.:  [id number]   MEDICAL RECORD NO.:  [id number]          PATIENT TYPE:  INP   LOCATION:  1524                         FACILITY:  [facility]   PHYSICIAN:  Ollen Gross, M.D.    DATE OF BIRTH:  11/07/1930   DATE OF ADMISSION:  03/23/2008  DATE OF DISCHARGE:  03/26/2008                               DISCHARGE SUMMARY   ADMISSION DIAGNOSES:  1. Right hip instability.  2. History of nonischemic cardiomyopathy  3. Prior documented EF of around 40%.  4. History of DVT.  5. Hypertension.  6. Dyslipidemia.  7. Obesity.  8. Chronic left bundle branch block.  9. Gastroesophageal reflux disease.  10.History of mild coronary arterial disease (25% LAD, 25% RCA, 24%      ostium per cardiac cath back in August 2005.   DISCHARGE DIAGNOSES:  1. Unstable right total hip arthroplasty status post right acetabular      revision.  2. Mild postop blood loss anemia did not require transfusion.      Remaining discharge same as admitting.   PROCEDURE:  March 23, 2008, right acetabular revision.  Surgeon Dr.  Lequita Halt, assistant Avel Peace PA-C.  Anesthesia general.  Consults  none.   BRIEF HISTORY:  [name] is a 77 year old male with a right total hip done  many years ago, revision done about 10 or 11 years ago, right hip  instability is developed now.  He has a cemented all poly acetabular  component concern in that it has loosened and shifted and now presents  for revision.   LABORATORY DATA:  Preop CBC:  Hemoglobin of 14.0, hematocrit 41.4,  platelets 218, white count 5.9.  Postop hemoglobin 11.3, drift down to  10.7 last H&H 10.6 and 30.9.  PT/PTT preop 13.3 and 28, respectively.  INR 1.0.  Serial protimes followed.  Last known PT/INR 29.7 and 2.6.  Chem panel on admission all within normal limits.  Serial B mets  followed.  Electrolytes remained within normal limits.  Preop UA  negative.  Blood type A+.   Hip Films on March 21, 2008:  Right hip  replacement in satisfactory  position alignment, no acute abnormality or changes.  Pelvis March 23, 2008, interval revision, right total hip replacement.   HOSPITAL COURSE:  The patient admitted to [facility],  tolerated procedure well, later transferred to recovery room on the  orthopedic floor, started back on home medications.  Actually doing very  well on the morning of day one when he only used an occasional PCA and  encouraged p.o. medications.  Electrolytes looked good along with a  hemoglobin.  Started on home medications.  Started getting up out of bed  on day #1.  By day #2, a little bit of soreness, but getting up and  walking about 40 feet with physical therapy, maintaining partial  weightbearing.  Continued to progress well.  Weaned over to p.o.  medications.  By day #3, the patient was progressing well, comfortable  with medications.  Dressing change incision looked good, meeting goals  and was discharged home.  DISCHARGE/PLAN:  1. Patient discharged home on March 26, 2008.  2. Discharge diagnoses please see above.  3. Discharge medications:  Percocet, Robaxin, Coumadin.  4. Heart-healthy diet.  5. Follow-up with Dr. Lequita Halt in the office, call for an appointment.  6. Activity:  Partial weightbearing right leg 25-50% hip precautions,      total hip protocol.  May start showering; however, do not submerge      incision under water.   DISPOSITION:  Home.   CONDITION ON DISCHARGE:  Improving.      Alexzandrew L. Perkins, P.A.C.      Ollen Gross, M.D.  Electronically Signed    ALP/MEDQ  D:  05/05/2008  T:  05/05/2008  Job:  [id number]   cc:   Valetta Mole. Swords, MD  [address] [place]  [place] [address]   Rollene Rotunda, MD, [facility]  [address]  [address]  [place]  [place] [address]   Bertram Millard. Dahlstedt, M.D.  Fax: [phone]

## 2010-09-07 NOTE — Discharge Summary (Signed)
NAMEEH, SESAY NO.:  [id number]   MEDICAL RECORD NO.:  [id number]          PATIENT TYPE:  OBV   LOCATION:  3705                         FACILITY:  [facility]   PHYSICIAN:  Rene Paci, M.D. LHCDATE OF BIRTH:  1930-09-08   DATE OF ADMISSION:  04/03/2004  DATE OF DISCHARGE:  04/04/2004                                 DISCHARGE SUMMARY   DISCHARGE DIAGNOSES:  1.  Fall, rule out syncope.  Work-up previously done, negative 24 hours'      observation on telemetry.  2.  Dyslipidemia.  3.  History of cardiomegaly with ejection fraction of 40%, followed by Dr.      Antoine Poche.  4.  Chronic left bundle branch block.   DISCHARGE MEDICATIONS:  As prior to admission and without change. These  include:  Vytorin, Altace and Coreg.   HOSPITAL FOLLOW-UP:  With Dr. Antoine Poche as previously scheduled for Monday,  February 20th and then with primary care physician, Dr. Cato Mulligan Tuesday, 21st  to reevaluate patient's overall health and question recurrent syncope.   CONDITION ON DISCHARGE:  Medically stable.   HOSPITAL COURSE:  PROBLEM FALL, RULE OUT SYNCOPE:  The patient is a 77-year-  old gentleman with previous work-up for chest pain and ischemic  cardiomyopathy followed by Dr. Antoine Poche.  The patient was brought to the  operating room the night of admission with episode of falling out at home  in the setting of alcohol intake, antihypertensive therapy the following day  and empty stomach. (Please see Dr. Scarlett Presto H&P for full details). Work-up  in the emergency room was negative, but wife insisted on overnight  observation to ensure there was nothing more going on.   Telemetry was pursued over this time which showed no irregular events.  Repeat enzymes or echo were not ordered during this hospital stay, and at  this time, as patient has had no recurrent symptoms, was felt stable for  discharge home. Continue follow-up with primary care physician and  cardiology as  previously scheduled and listed above.  No medication changes  were made in this regimen.      VL/MEDQ  D:  06/05/2004  T:  06/05/2004  Job:  [id number]

## 2010-09-07 NOTE — Discharge Summary (Signed)
[name] [name], [name] [name]                           ACCOUNT NO.:  [id number]   MEDICAL RECORD NO.:  [id number]                   PATIENT TYPE:  INP   LOCATION:  0347                                 [facility]ACILITY:  [facility]   PHYSICIAN:  Rene Paci, M.D. [facility]          DATE OF BIRTH:  [birth year]   DATE OF ADMISSION:  11/09/2003  DATE OF DISCHARGE:  11/10/2003                                 DISCHARGE SUMMARY   DISCHARGE DIAGNOSES:  1. Chest pain, atypical, rule out myocardial infarction.  Negative with     serial cardiac enzymes.  For outpatient Cardiolite in a.m. (see below).  2. Left bundle branch block on electrocardiogram, nonspecific.  For     outpatient followup (see below).  3. Gastroesophageal reflux disease.  4. Dyslipidemia.  5. History of deep venous thrombosis, remote.  CT of the chest negative for     pulmonary embolus.   DISCHARGE MEDICATIONS:  1. Protonix 40 mg p.o. q.d. x30 day trial.  2. Nitroglycerin 0.4 mg sublingual q.5m. x3 p.r.n. chest pain.  Call if     unrelieved after three.  3. Vytorin 10/20 1 p.o. q.d. as prior to admission.   DISPOSITION:  Patient is discharged to home in medically stable condition.  He is chest painfree without any symptoms of reflux at this time.  Hospital  followup is scheduled at [facility] for tomorrow, Friday, July 22nd  at 9:30 a.m. to undergo an Adenosine Cardiolite test to further stratify for  heart disease.  After this, followup will be with his primary care physician  in the next 2-3 weeks, to be arranged by patient.   HOSPITAL COURSE:  Atypical chest pain:  Patient is a pleasant 77 year old  gentleman with no history of coronary disease, who presented to the  emergency room with approximately 48 hours of intermittent substernal chest  pain, occasionally burning in quality, worse with supine position, better  when sitting up.  Because of the persisting chest pain, he presented to the  emergency room on the day of admission  to evaluate for possible cardiac  causes.  Cardiac enzymes were negative.  An EKG showed a left bundle branch  block and without previous comparison, this was felt to be concerning.  A  cardiology consult was obtained.  Cardiology agreed that left bundle branch  block was associated with underlying coronary disease in approximately 50%  of cases but was itself a nonspecific finding.  We completed the cardiac  rule-out.  Enzymes remained negative.  His telemetry was negative.  He was  felt stable for discharge home with outpatient Cardiolite followup, as  described above.  His symptoms were likely due to reflux and GERD symptoms.  He is begun on a Protonix 30-day trial, as described.  Fasting lipids were  rechecked and pending at the time of discharge, but patient will continue  his medications as prior to admission.  Of note, as the  patient has a  history of DVT, those symptoms were not themselves concerning for PE, as he  has no hypoxic and no pleuritic quality or tachycardia.  CT of the chest was  obtained and excluded PE as a possibility.  Nonspecific reactive lymph  changes and chronic interstitial changes, as were present in March, 2002.                                               Rene Paci, M.D. [facility]    VL/MEDQ  D:  11/10/2003  T:  11/10/2003  Job:  [id number]

## 2010-09-07 NOTE — Op Note (Signed)
NAMETREW, SUNDE NO.:  [id number]   MEDICAL RECORD NO.:  [id number]          PATIENT TYPE:  INP   LOCATION:  2899                         FACILITY:  [facility]   PHYSICIAN:  Hilda Lias, M.D.   DATE OF BIRTH:  [birth year]   DATE OF PROCEDURE:  08/21/2004  DATE OF DISCHARGE:                                 OPERATIVE REPORT   PREOPERATIVE DIAGNOSIS:  C5-C6, C6-C7 herniated disk with stenosis, early  myelopathy.   POSTOPERATIVE DIAGNOSIS:  C5-C6, C6-C7 herniated disk with stenosis, early  myelopathy.   PROCEDURE:  Decompression of the 5-6 and 6-7 spinal cord nerve root with the  microscope followed by allograft and a plate.   SURGEON:  Hilda Lias, M.D.   ASSISTANT:  Dr. Venetia Maxon.   CLINICAL HISTORY:  The patient was admitted because of neck pain with  weakness in both upper extremities and difficulty with equilibrium.  X-rays  show severe stenosis at the L5-6, 6-7, being the worse at the later level.  Surgery was advised.  The risk was explained in the history and physical.  Patient might have a possibility of carpal tunnel syndrome.   PROCEDURE:  The patient was taken to the OR and after intubation the neck  was prepped with Betadine.  A transverse incision was made through the skin,  platysma, down to cervical spine.  The first x-ray showed that indeed we  were at the L4-5.  From then on we went down to 5-6.  The patient has a  large osteophyte at this level, was removed with the Leksell.  We entered  the disk space which was calcified.  With the drill we were able to remove  the calcified disk.  Then we were able to remove the transverse ligament  posteriorly which was calcified.  With the crescent punches for the drill,  we decompressed the spinal cord and both C6 levels.  The same procedure was  done at C6 and 7 but in the right side we found a large herniated disk going  upward, compromising the C7 nerve root as well as the spinal cord which was  displaced to the left side.  After having good decompression, both nerve  root and spinal cord area was irrigated.  The endplates were drilled.  Then  two pieces of allograft, 7 mm, with __________ and bone chip in the middle,  were used to stabilize the area.  There was good space between the posterior  aspect of the plate of the graft and the spinal cord.  Having good position  on the bone graft, plate from C5 down to C7 __________ was used.  X-ray  showed good position of the upper part of the plate.  It was difficult to  see C6, C7 although we knew by x-ray that there was plenty of space in the  lower part of the plate.  The area was irrigated.  A Jackson-Pratt was  placed in the prevertebral space.  From then on the wound was closed with  Vicryl and Steri-Strips.      EB/MEDQ  D:  08/21/2004  T:  08/21/2004  Job:  [id number]

## 2010-09-07 NOTE — Op Note (Signed)
[name] [name], [name] [name]                 ACCOUNT NO.:  [id number]   MEDICAL RECORD NO.:  [id number]          PATIENT TYPE:  AMB   LOCATION:  NESC                         [facility]ACILITY:  [facility]   PHYSICIAN:  Bertram Millard. Dahlstedt, M.D.DATE OF BIRTH:  08/16/1930   DATE OF PROCEDURE:  08/25/2006  DATE OF DISCHARGE:                               OPERATIVE REPORT   PREOPERATIVE DIAGNOSIS:  Adenocarcinoma of the prostate, clinical stage  T2a with Gleason score 3+4.   POSTOPERATIVE DIAGNOSIS:  Adenocarcinoma of the prostate, clinical stage  T2a with Gleason score 3+4.   PRINCIPAL PROCEDURE:  I-125 brachytherapy, cystoscopy.   SURGEON:  Dr. Retta Diones.   RADIATION ONCOLOGIST:  Dr. Chipper Herb   ANESTHESIA:  General with LMA.   COMPLICATIONS:  None.   BRIEF HISTORY:  [name] [name] is a 77 year old male with adenocarcinoma of  the prostate.  This was diagnosed recently.  The patient had a prostate  nodule and underwent biopsy.  His PSA was 1.27.  biopsy revealed a  Gleason's 3+4 on 20% of the tissue on the right side.   The patient has received consultation with both myself and Dr. Dayton Scrape.  He has chosen to have brachytherapy.  Risks and complications of the  procedure have been discussed with the patient by both myself and Dr.  Dayton Scrape.  He desires to proceed.   DESCRIPTION OF PROCEDURE:  The patient received preoperative IV  antibiotics and in the holding area, was identified and taken to the  operating room.  General anesthetic was administered with the left  mammary artery.  He was placed in the dorsal lithotomy position.  Genitalia and perineum were prepped.  A Foley catheter was placed.  Scrotum was retracted anteriorly.  A rectal tube was placed.  The  transrectal ultrasound probe was placed.  Measurement of the prostate  was performed by Dr. Dayton Scrape.  The urethra, prostatic, and rectal  outlines were then made.  The template was then placed against the  patient's perineum.  Two holding  needles were placed.  At this point, I  was called in.  Eighteen needles were used to place 54 seeds.  I  followed the template that the computer program provided.  For the  specifics, please see the Nucletron Sonographic planning.  Following  placement of all 54 seeds, the needles were removed, the template  removed, the Foley removed, and flexible cystoscopy was performed.  No  seeds were seen within the prostate or within the bladder.  There was 1  small diverticulum in the posterior trigonal area which was entered and  found to be normal.  The remaining bladder appeared normal.   At this point, the scope was removed.  The patient was awakened and  taken to the PACU in stable condition.   He will follow up with both me and Dr. Dayton Scrape in 3 weeks.  He was  discharged on Cipro 250 mg p.o. b.i.d. x5 days and Vicodin.      Bertram Millard. Dahlstedt, M.D.  Electronically Signed     SMD/MEDQ  D:  08/25/2006  T:  08/25/2006  Job:  [id number]   cc:   Valetta Mole. Swords, MD  [address] [place]  [place] [address]

## 2010-09-07 NOTE — H&P (Signed)
[facility]  Patient:    [name] [name], [name] [name]                          MRN: [id number] Adm. Date:  07/14/00 Attending:  Ollen Gross, M.D. Dictator:   Ralene Bathe, P.A. CC:         Valetta Mole. Swords, M.D. [facility]   History and Physical  DATE OF BIRTH:  Feb 17, 1931.  DATE OF HISTORY AND PHYSICAL:  July 09, 2000.  CHIEF COMPLAINT:  Left hip pain.  HISTORY OF PRESENT ILLNESS:  This is a 77 year old male who has been followed by our practice for severe hip pain.  He had a MRI of his lumbar spine to rule out any lumbar pathology; however, his x-rays of his hip have shown rapidly progressively osteoarthritis.  X-rays of the hip several months ago showed mild shortening; however, most recent x-rays showed bone-on-bone deformity. He is having significant pain.  He has had limitations of internal and external rotation and significant disability to his ADLs.  At this time, he wishes to proceed with hip replacement.  Risks and benefits were discussed with patient and he is in agreement and wishes to proceed.  PAST MEDICAL HISTORY: 1. Hypercholesterolemia. 2. Osteoarthritis. 3. History of DVT in left leg.  PAST SURGICAL HISTORY: 1. Right total hip arthroplasty. 2. Right total hip revision with subsequently two dislocations.  MEDICATIONS:  Lipitor 20 mg q.d.  ALLERGIES:  No known drug allergies.  SOCIAL HISTORY:  Patient is married.  One-story home.  Does not smoke.  He uses occasional alcohol.  Four steps into the usual entrance.  FAMILY HISTORY:  Family history is significant for father with diabetes.  No history of coronary artery disease, cerebrovascular disease or cancer.  REVIEW OF SYSTEMS:  Patient denies any recent fevers, chills or night sweats. No bleeding tendencies.  CNS:  No blurred or double vision, seizures, headaches or paralysis.  RESPIRATORY:  No shortness of breath, productive cough or hemoptysis.  CARDIOVASCULAR:  No chest pain, angina  or orthopnea. GI:  No nausea, vomiting, constipation, melena or bloody stools. GENITOURINARY:  No dysuria or hematuria.  MUSCULOSKELETAL:  As pertinent to present illness.  PRIMARY MEDICAL PHYSICIAN:  Dr. Valetta Mole. Swords.  PHYSICAL EXAMINATION:  GENERAL:  Well-developed, well-nourished 77 year old male who actually appears younger than stated age.  VITAL SIGNS:  Pulse 72, respirations 10, blood pressure 138/80.  HEENT:  Normocephalic.  Extraocular motions intact.  NECK:  Supple.  No lymphadenopathy.  No carotid bruits appreciated on exam.  CHEST:  Clear to auscultation bilaterally.  HEART:  Regular rate and rhythm.  No murmurs, gallops, rubs, heaves or thrills.  ABDOMEN:  Positive bowel sounds.  Soft and nontender.  EXTREMITIES:  As above.  IMPRESSION: 1. Osteoarthritis, left hip. 2. Hypercholesterolemia. 3. Deep venous thrombosis in the left leg.  PLAN:  Left total hip arthroplasty.DD:  07/21/00 TD:  07/22/00 Job: [id number]

## 2010-09-07 NOTE — Cardiovascular Report (Signed)
NAME:  [name] [name], [name] [name]                           ACCOUNT NO.:  [id number]   MEDICAL RECORD NO.:  [id number]                   PATIENT TYPE:  OIB   LOCATION:  6598                                 FACILITY:  [facility]   PHYSICIAN:  Rollene Rotunda, M.D.                DATE OF BIRTH:  [birth year]   DATE OF PROCEDURE:  DATE OF DISCHARGE:                              CARDIAC CATHETERIZATION   PRIMARY CARE PHYSICIAN:  Valetta Mole. Swords, M.D. [facility].   PROCEDURE:  Left heart catheterization/coronary arteriography.   INDICATIONS:  A patient with cardiomyopathy with an ejection fraction of  approximately 40%.   PROCEDURE NOTE:  Left heart catheterization performed via the right femoral  artery.  The artery was cannulated using anterior wall puncture.  A #4  French arterial sheath was inserted via the modified Seldinger technique.  Preformed Judkins and a pigtail catheter were utilized.  The patient  tolerated the procedure well and left the lab in a stable condition.   RESULTS HEMODYNAMICS:  1. LV 121/70, AO 130/12.  2. Coronaries:     a. Left main was normal.     b. The LAD had a proximal 25% stenosis.     c. There was a first and second diagonal which were moderate sized and        normal.     d. The circumflex was a very large vessel.     e. In the A-V groove there was ostial 25% stenosis and diffuse luminal        irregularities.     f. There was a very large mid obtuse marginal which was normal.     g. There was a moderate sized posterior lateral which was normal.     h. The right coronary artery was dominant but not a particularly large        vessel.  There was long mid 25% stenosis.   LEFT VENTRICULOGRAM:  The left ventriculogram was obtained in the REO  projection.  The EF was approximately 40% with mild global hypokinesis.   CONCLUSION:  1. Mild coronary plaque.  2. Mild left ventricular dysfunction.   PLAN:  The patient will have medical management of his reduced ejection  fraction.                                               Rollene Rotunda, M.D.   JH/MEDQ  D:  12/02/2003  T:  12/02/2003  Job:  [id number]   cc:   Valetta Mole. Swords, M.D. [facility]

## 2010-09-10 ENCOUNTER — Telehealth: Payer: Self-pay | Admitting: Cardiology

## 2010-09-10 NOTE — Telephone Encounter (Signed)
Walk In Pt Form " Pt Dropped off Pt Order Form for Refills" sent to Message Nurse  09/10/10/km

## 2010-09-19 ENCOUNTER — Ambulatory Visit (INDEPENDENT_AMBULATORY_CARE_PROVIDER_SITE_OTHER): Payer: Medicare Other | Admitting: Internal Medicine

## 2010-09-19 DIAGNOSIS — I447 Left bundle-branch block, unspecified: Secondary | ICD-10-CM

## 2010-09-19 DIAGNOSIS — Z8546 Personal history of malignant neoplasm of prostate: Secondary | ICD-10-CM

## 2010-09-19 DIAGNOSIS — E785 Hyperlipidemia, unspecified: Secondary | ICD-10-CM

## 2010-09-19 DIAGNOSIS — I10 Essential (primary) hypertension: Secondary | ICD-10-CM

## 2010-09-19 DIAGNOSIS — I509 Heart failure, unspecified: Secondary | ICD-10-CM

## 2010-09-19 DIAGNOSIS — E875 Hyperkalemia: Secondary | ICD-10-CM

## 2010-09-19 LAB — CBC WITH DIFFERENTIAL/PLATELET
Basophils Relative: 0.6 % (ref 0.0–3.0)
Eosinophils Relative: 5.3 % — ABNORMAL HIGH (ref 0.0–5.0)
Lymphs Abs: 1.7 10*3/uL (ref 0.7–4.0)
Monocytes Absolute: 0.9 10*3/uL (ref 0.1–1.0)
Monocytes Relative: 15.1 % — ABNORMAL HIGH (ref 3.0–12.0)
Neutro Abs: 3 10*3/uL (ref 1.4–7.7)
Neutrophils Relative %: 50.2 % (ref 43.0–77.0)
Platelets: 195 10*3/uL (ref 150.0–400.0)
RDW: 13.6 % (ref 11.5–14.6)
WBC: 6.1 10*3/uL (ref 4.5–10.5)

## 2010-09-19 LAB — BASIC METABOLIC PANEL
BUN: 18 mg/dL (ref 6–23)
Chloride: 104 mEq/L (ref 96–112)
Glucose, Bld: 94 mg/dL (ref 70–99)

## 2010-09-19 LAB — HEPATIC FUNCTION PANEL
ALT: 37 U/L (ref 0–53)
Bilirubin, Direct: 0.1 mg/dL (ref 0.0–0.3)
Total Bilirubin: 0.6 mg/dL (ref 0.3–1.2)

## 2010-09-19 LAB — LIPID PANEL
Cholesterol: 138 mg/dL (ref 0–200)
HDL: 62.2 mg/dL (ref >39.00)
LDL Cholesterol: 49 mg/dL (ref 0–99)
Triglycerides: 135 mg/dL (ref 0.0–149.0)

## 2010-09-20 ENCOUNTER — Ambulatory Visit: Payer: Self-pay | Admitting: Internal Medicine

## 2010-09-20 ENCOUNTER — Other Ambulatory Visit: Payer: Medicare Other

## 2010-09-20 DIAGNOSIS — E875 Hyperkalemia: Secondary | ICD-10-CM

## 2010-09-20 LAB — BASIC METABOLIC PANEL
BUN: 21 mg/dL (ref 6–23)
GFR: 71.9 mL/min (ref >60.00)
Glucose, Bld: 95 mg/dL (ref 70–99)
Potassium: 5 mEq/L (ref 3.5–5.1)

## 2010-09-24 ENCOUNTER — Ambulatory Visit: Payer: Self-pay | Admitting: Internal Medicine

## 2010-09-25 NOTE — Progress Notes (Signed)
  Subjective:    Patient ID: [name] [name], male    DOB: [birth year], 77 y.o.   MRN: [id number]  HPI  Patient comes in for followup. He needs medications refilled. Patient with a history of left ventricular dysfunction. He's never had overt congestive heart failure. He does have hypertension and left bundle branch block. Patient also has a history of prostate cancer and he follows up with urology. Patient has a history of hyperlipidemia. He is tolerating his medications without difficulty. He needs followup.  Review of Systems  patient denies chest pain, shortness of breath, orthopnea. Denies lower extremity edema, abdominal pain, change in appetite, change in bowel movements. Patient denies rashes, musculoskeletal complaints. No other specific complaints in a complete review of systems.     Objective:   Physical Exam  well-developed well-nourished male in no acute distress. HEENT exam atraumatic, normocephalic, neck supple without jugular venous distention. Chest clear to auscultation cardiac exam S1-S2 are regular. Abdominal exam overweight with bowel sounds, soft and nontender. Extremities no edema. Neurologic exam is alert with a normal gait.        Assessment & Plan:

## 2010-09-25 NOTE — Assessment & Plan Note (Signed)
Has regular followup with urology. 

## 2010-09-25 NOTE — Assessment & Plan Note (Signed)
Tolerating Vytorin. Check laboratory work today. We'll refill medications as appropriate.

## 2010-09-25 NOTE — Assessment & Plan Note (Signed)
Reasonably controlled. Continue current medications 

## 2010-09-25 NOTE — Assessment & Plan Note (Signed)
Asymptomatic.  Continue current medications. 

## 2010-09-26 ENCOUNTER — Encounter: Payer: Self-pay | Admitting: Internal Medicine

## 2010-10-19 ENCOUNTER — Telehealth: Payer: Self-pay | Admitting: Cardiology

## 2010-10-19 MED ORDER — CARVEDILOL 12.5 MG PO TABS: 12.5000 mg | ORAL_TABLET | Freq: Two times a day (BID) | ORAL | Status: DC

## 2010-10-19 NOTE — Telephone Encounter (Signed)
Refill medication coreg 12.5 mg. [facility].

## 2010-12-17 ENCOUNTER — Other Ambulatory Visit: Payer: Self-pay | Admitting: *Deleted

## 2010-12-17 MED ORDER — EZETIMIBE-SIMVASTATIN 10-40 MG PO TABS: 1.0000 | ORAL_TABLET | Freq: Every day | ORAL | Status: DC

## 2011-01-15 LAB — PROTIME-INR
INR: 0.9
Prothrombin Time: 12.8

## 2011-01-15 LAB — CBC
Hemoglobin: 13
MCHC: 32.5
RDW: 13.8

## 2011-01-15 LAB — COMPREHENSIVE METABOLIC PANEL
ALT: 40
BUN: 13
Calcium: 9.6
Glucose, Bld: 127 — ABNORMAL HIGH
Sodium: 141
Total Protein: 7.2

## 2011-01-15 LAB — URINALYSIS, ROUTINE W REFLEX MICROSCOPIC
Nitrite: NEGATIVE
Protein, ur: NEGATIVE
Specific Gravity, Urine: 1.021
Urobilinogen, UA: 0.2

## 2011-01-15 LAB — DIFFERENTIAL
Lymphocytes Relative: 15
Lymphs Abs: 1.3
Monocytes Relative: 7
Neutro Abs: 6.7
Neutrophils Relative %: 76

## 2011-01-15 LAB — APTT: aPTT: 26

## 2011-01-22 LAB — CBC
MCV: 93.7 fL (ref 78.0–100.0)
Platelets: 218 10*3/uL (ref 150–400)
RDW: 12.9 % (ref 11.5–15.5)
WBC: 5.9 10*3/uL (ref 4.0–10.5)

## 2011-01-22 LAB — COMPREHENSIVE METABOLIC PANEL
AST: 36 U/L (ref 0–37)
Albumin: 3.6 g/dL (ref 3.5–5.2)
BUN: 19 mg/dL (ref 6–23)
Chloride: 104 mEq/L (ref 96–112)
Creatinine, Ser: 0.88 mg/dL (ref 0.4–1.5)
GFR calc Af Amer: >60 mL/min (ref >60)
Potassium: 4.8 mEq/L (ref 3.5–5.1)
Total Bilirubin: 0.7 mg/dL (ref 0.3–1.2)
Total Protein: 7.2 g/dL (ref 6.0–8.3)

## 2011-01-22 LAB — URINALYSIS, ROUTINE W REFLEX MICROSCOPIC
Bilirubin Urine: NEGATIVE
Ketones, ur: NEGATIVE mg/dL
Nitrite: NEGATIVE
Protein, ur: NEGATIVE mg/dL
Urobilinogen, UA: 0.2 mg/dL (ref 0.0–1.0)
pH: 6 (ref 5.0–8.0)

## 2011-01-22 LAB — ABO/RH: ABO/RH(D): A POS

## 2011-01-22 LAB — PROTIME-INR: INR: 1 (ref 0.00–1.49)

## 2011-01-22 LAB — TYPE AND SCREEN: Antibody Screen: NEGATIVE

## 2011-01-22 LAB — APTT: aPTT: 28 seconds (ref 24–37)

## 2011-01-24 LAB — BASIC METABOLIC PANEL
BUN: 10 mg/dL (ref 6–23)
CO2: 26 mEq/L (ref 19–32)
Calcium: 8.2 mg/dL — ABNORMAL LOW (ref 8.4–10.5)
Calcium: 8.2 mg/dL — ABNORMAL LOW (ref 8.4–10.5)
Creatinine, Ser: 0.88 mg/dL (ref 0.4–1.5)
Creatinine, Ser: 0.98 mg/dL (ref 0.4–1.5)
GFR calc Af Amer: >60 mL/min (ref >60)
GFR calc non Af Amer: >60 mL/min (ref >60)
GFR calc non Af Amer: >60 mL/min (ref >60)
Glucose, Bld: 139 mg/dL — ABNORMAL HIGH (ref 70–99)
Sodium: 136 mEq/L (ref 135–145)

## 2011-01-24 LAB — PROTIME-INR
INR: 1 (ref 0.00–1.49)
INR: 1.5 (ref 0.00–1.49)
INR: 2.6 — ABNORMAL HIGH (ref 0.00–1.49)
Prothrombin Time: 13.5 seconds (ref 11.6–15.2)
Prothrombin Time: 18.8 seconds — ABNORMAL HIGH (ref 11.6–15.2)

## 2011-01-24 LAB — CBC
HCT: 33 % — ABNORMAL LOW (ref 39.0–52.0)
Hemoglobin: 10.6 g/dL — ABNORMAL LOW (ref 13.0–17.0)
MCHC: 33.2 g/dL (ref 30.0–36.0)
Platelets: 167 10*3/uL (ref 150–400)
Platelets: 183 10*3/uL (ref 150–400)
RBC: 3.42 MIL/uL — ABNORMAL LOW (ref 4.22–5.81)
RDW: 13.6 % (ref 11.5–15.5)
RDW: 13.6 % (ref 11.5–15.5)
WBC: 7.7 10*3/uL (ref 4.0–10.5)
WBC: 9.3 10*3/uL (ref 4.0–10.5)

## 2011-02-05 ENCOUNTER — Ambulatory Visit (INDEPENDENT_AMBULATORY_CARE_PROVIDER_SITE_OTHER): Payer: Medicare Other | Admitting: Internal Medicine

## 2011-02-05 ENCOUNTER — Encounter: Payer: Self-pay | Admitting: Internal Medicine

## 2011-02-05 DIAGNOSIS — Z23 Encounter for immunization: Secondary | ICD-10-CM

## 2011-02-05 DIAGNOSIS — I509 Heart failure, unspecified: Secondary | ICD-10-CM

## 2011-02-05 DIAGNOSIS — Z8546 Personal history of malignant neoplasm of prostate: Secondary | ICD-10-CM

## 2011-02-05 DIAGNOSIS — I10 Essential (primary) hypertension: Secondary | ICD-10-CM

## 2011-02-05 DIAGNOSIS — E785 Hyperlipidemia, unspecified: Secondary | ICD-10-CM

## 2011-02-05 LAB — CBC WITH DIFFERENTIAL/PLATELET
Basophils Absolute: 0 10*3/uL (ref 0.0–0.1)
Eosinophils Absolute: 0.4 10*3/uL (ref 0.0–0.7)
HCT: 42.5 % (ref 39.0–52.0)
Lymphs Abs: 1.6 10*3/uL (ref 0.7–4.0)
MCHC: 33.9 g/dL (ref 30.0–36.0)
MCV: 95.1 fl (ref 78.0–100.0)
Monocytes Absolute: 0.8 10*3/uL (ref 0.1–1.0)
Platelets: 202 10*3/uL (ref 150.0–400.0)
RDW: 14 % (ref 11.5–14.6)

## 2011-02-05 LAB — LIPID PANEL
HDL: 63.7 mg/dL (ref >39.00)
Triglycerides: 78 mg/dL (ref 0.0–149.0)

## 2011-02-05 LAB — BASIC METABOLIC PANEL
Calcium: 9.4 mg/dL (ref 8.4–10.5)
GFR: 66.72 mL/min (ref >60.00)
Potassium: 5.7 mEq/L — ABNORMAL HIGH (ref 3.5–5.1)
Sodium: 143 mEq/L (ref 135–145)

## 2011-02-05 LAB — HEPATIC FUNCTION PANEL
AST: 49 U/L — ABNORMAL HIGH (ref 0–37)
Albumin: 4 g/dL (ref 3.5–5.2)

## 2011-02-05 LAB — TSH: TSH: 2 u[IU]/mL (ref 0.35–5.50)

## 2011-02-05 MED ORDER — HYDROCODONE-ACETAMINOPHEN 5-325 MG PO TABS: 1.0000 | ORAL_TABLET | Freq: Two times a day (BID) | ORAL | Status: AC | PRN

## 2011-02-05 NOTE — Assessment & Plan Note (Signed)
Controlled, continue ssame

## 2011-02-10 NOTE — Progress Notes (Signed)
  Subjective:    Patient ID: [name] [name], male    DOB: December 05, 1930, 77 y.o.   MRN: [id number]  HPI  Patient comes in for followup of multiple medical problems. He has chronic hip and back pain and uses occasional hydrocodone. He has a history of hypertension. Tolerating his medications without difficulty. Occasionally he will have symptoms that sound orthostatic when he stands quickly. Patient has a left bundle-branch block and history of congestive heart failure. Echocardiograms have demonstrated moderately low ejection fraction in the past.  Past Medical History  Diagnosis Date  . Colon polyps   . Diverticulosis of colon   . CHF (congestive heart failure)   . Varicose vein   . LBBB (left bundle branch block)   . DVT (deep venous thrombosis)   . Hypertension   . Hyperlipidemia   . History of prostate cancer    Past Surgical History  Procedure Date  . Appendectomy   . Cervical laminectomy   . Total hip arthroplasty     bilateral    reports that he has never smoked. He has never used smokeless tobacco. He reports that he drinks alcohol. He reports that he does not use illicit drugs. family history includes Diabetes in his father. Allergies  Allergen Reactions  . Valsartan     REACTION: passes out     Review of Systems    patient denies chest pain, shortness of breath, orthopnea. Denies lower extremity edema, abdominal pain, change in appetite, change in bowel movements. Patient denies rashes, musculoskeletal complaints. No other specific complaints in a complete review of systems.    Objective:   Physical Exam  Well-developed male in no acute distress. Neck is supple. Chest clear to auscultation cardiac exam S1-S2 are regular. Abdomen Central sounds, soft. Extremities no edema. Note he is wearing compression stockings.      Assessment & Plan:

## 2011-02-10 NOTE — Assessment & Plan Note (Signed)
No symptomatic recurrence. No change in medications.

## 2011-02-10 NOTE — Assessment & Plan Note (Signed)
Tolerating medications. We'll continue the same.

## 2011-05-15 ENCOUNTER — Encounter: Payer: Self-pay | Admitting: Internal Medicine

## 2011-06-25 ENCOUNTER — Ambulatory Visit (AMBULATORY_SURGERY_CENTER): Payer: Medicare Other | Admitting: *Deleted

## 2011-06-25 VITALS — Ht 70.0 in | Wt 205.3 lb

## 2011-06-25 DIAGNOSIS — Z1211 Encounter for screening for malignant neoplasm of colon: Secondary | ICD-10-CM

## 2011-06-25 MED ORDER — PEG-KCL-NACL-NASULF-NA ASC-C 100 G PO SOLR: ORAL | Status: DC

## 2011-07-09 ENCOUNTER — Ambulatory Visit (AMBULATORY_SURGERY_CENTER): Payer: Medicare Other | Admitting: Internal Medicine

## 2011-07-09 ENCOUNTER — Encounter: Payer: Self-pay | Admitting: Internal Medicine

## 2011-07-09 VITALS — BP 151/85 | HR 67 | Temp 97.2°F | Resp 18 | Ht 70.0 in | Wt 205.0 lb

## 2011-07-09 DIAGNOSIS — Z860100 Personal history of colon polyps, unspecified: Secondary | ICD-10-CM

## 2011-07-09 DIAGNOSIS — K5730 Diverticulosis of large intestine without perforation or abscess without bleeding: Secondary | ICD-10-CM

## 2011-07-09 DIAGNOSIS — Z1211 Encounter for screening for malignant neoplasm of colon: Secondary | ICD-10-CM

## 2011-07-09 MED ORDER — SODIUM CHLORIDE 0.9 % IV SOLN: 500.0000 mL | INTRAVENOUS | Status: DC

## 2011-07-09 NOTE — Patient Instructions (Signed)
YOU HAD AN ENDOSCOPIC PROCEDURE TODAY AT THE [facility]: Refer to the procedure report that was given to you for any specific questions about what was found during the examination.  If the procedure report does not answer your questions, please call your gastroenterologist to clarify.  If you requested that your care partner not be given the details of your procedure findings, then the procedure report has been included in a sealed envelope for you to review at your convenience later.  YOU SHOULD EXPECT: Some feelings of bloating in the abdomen. Passage of more gas than usual.  Walking can help get rid of the air that was put into your GI tract during the procedure and reduce the bloating. If you had a lower endoscopy (such as a colonoscopy or flexible sigmoidoscopy) you may notice spotting of blood in your stool or on the toilet paper. If you underwent a bowel prep for your procedure, then you may not have a normal bowel movement for a few days.  DIET: Your first meal following the procedure should be a light meal and then it is ok to progress to your normal diet.  A half-sandwich or bowl of soup is an example of a good first meal.  Heavy or fried foods are harder to digest and may make you feel nauseous or bloated.  Likewise meals heavy in dairy and vegetables can cause extra gas to form and this can also increase the bloating.  Drink plenty of fluids but you should avoid alcoholic beverages for 24 hours.  ACTIVITY: Your care partner should take you home directly after the procedure.  You should plan to take it easy, moving slowly for the rest of the day.  You can resume normal activity the day after the procedure however you should NOT DRIVE or use heavy machinery for 24 hours (because of the sedation medicines used during the test).    SYMPTOMS TO REPORT IMMEDIATELY: A gastroenterologist can be reached at any hour.  During normal business hours, 8:30 AM to 5:00 PM Monday through Friday,  call [phone].  After hours and on weekends, please call the GI answering service at [phone] who will take a message and have the physician on call contact you.   Following lower endoscopy (colonoscopy or flexible sigmoidoscopy):  Excessive amounts of blood in the stool  Significant tenderness or worsening of abdominal pains  Swelling of the abdomen that is new, acute  Fever of 100F or higher    FOLLOW UP: If any biopsies were taken you will be contacted by phone or by letter within the next 1-3 weeks.  Call your gastroenterologist if you have not heard about the biopsies in 3 weeks.  Our staff will call the home number listed on your records the next business day following your procedure to check on you and address any questions or concerns that you may have at that time regarding the information given to you following your procedure. This is a courtesy call and so if there is no answer at the home number and we have not heard from you through the emergency physician on call, we will assume that you have returned to your regular daily activities without incident.  SIGNATURES/CONFIDENTIALITY: You and/or your care partner have signed paperwork which will be entered into your electronic medical record.  These signatures attest to the fact that that the information above on your After Visit Summary has been reviewed and is understood.  Full responsibility of the confidentiality   of this discharge information lies with you and/or your care-partner.     

## 2011-07-09 NOTE — Op Note (Signed)
[facility] [address] [place], [place]  [address]  COLONOSCOPY PROCEDURE REPORT  PATIENT:  [name], [name]  MR#:  [id number] BIRTHDATE:  05-09-1930, 80 yrs. old  GENDER:  male ENDOSCOPIST:  Wilhemina Bonito. Eda Keys, MD REF. BY:  Surveillance Program Recall, PROCEDURE DATE:  07/09/2011 PROCEDURE:  Surveillance Colonoscopy ASA CLASS:  Class II INDICATIONS:  history of pre-cancerous (adenomatous) colon polyps, surveillance and high-risk screening ; index exam 06-2006 w/ TA MEDICATIONS:   MAC sedation, administered by CRNA, propofol (Diprivan) 150 mg IV  DESCRIPTION OF PROCEDURE:   After the risks benefits and alternatives of the procedure were thoroughly explained, informed consent was obtained.  Digital rectal exam was performed and revealed no abnormalities.   The [id number] endoscope was introduced through the anus and advanced to the cecum, which was identified by both the appendix and ileocecal valve, without limitations.  The quality of the prep was excellent, using MoviPrep.  The instrument was then slowly withdrawn as the colon was fully examined. <<PROCEDUREIMAGES>>  FINDINGS:  Moderate diverticulosis was found throughout the colon. Otherwise normal colonoscopy without  polyps, masses, vascular ectasias, or inflammatory changes.   Retroflexed views in the rectum revealed internal hemorrhoids.    The time to cecum = 3:46 minutes. The scope was then withdrawn in 10:11  minutes from the cecum and the procedure completed.  COMPLICATIONS:  None  ENDOSCOPIC IMPRESSION: 1) Moderate diverticulosis throughout the colon 2) Otherwise normal colonoscopy 3) Internal hemorrhoids  RECOMMENDATIONS: 1) Return to the care of your primary provider. GI follow up as needed  ______________________________ Wilhemina Bonito. Eda Keys, MD  CC:  Lindley Magnus, MD;  The Patient  n. eSIGNED:   Wilhemina Bonito. Eda Keys at 07/09/2011 09:42 AM  [place] Nunnery, [phone]

## 2011-07-09 NOTE — Progress Notes (Signed)
Patient did not experience any of the following events: a burn prior to discharge; a fall within the facility; wrong site/side/patient/procedure/implant event; or a hospital transfer or hospital admission upon discharge from the facility. (G8907) Patient did not have preoperative order for IV antibiotic SSI prophylaxis. (G8918)  

## 2011-07-10 ENCOUNTER — Telehealth: Payer: Self-pay | Admitting: *Deleted

## 2011-07-10 NOTE — Telephone Encounter (Signed)
  Follow up Call-  Call back number 07/09/2011  Post procedure Call Back phone  # [phone]  Permission to leave phone message Yes     Patient questions:  Do you have a fever, pain , or abdominal swelling? no Pain Score  0 *  Have you tolerated food without any problems? yes  Have you been able to return to your normal activities? yes  Do you have any questions about your discharge instructions: Diet   no Medications  no Follow up visit  no  Do you have questions or concerns about your Care? no  Actions: * If pain score is 4 or above: No action needed, pain <4.

## 2011-09-02 ENCOUNTER — Other Ambulatory Visit: Payer: Self-pay | Admitting: Cardiology

## 2011-09-02 ENCOUNTER — Telehealth: Payer: Self-pay | Admitting: Cardiology

## 2011-09-02 MED ORDER — CARVEDILOL 12.5 MG PO TABS: 12.5000 mg | ORAL_TABLET | Freq: Two times a day (BID) | ORAL | Status: DC

## 2011-09-02 NOTE — Telephone Encounter (Signed)
New message:  Pt called requesting refills and to ask about when he is due to be seen again.  Nothing in recalls/chart.  Please check and call patient back with an advise if he needs to be seen.

## 2011-09-02 NOTE — Telephone Encounter (Signed)
..   Requested Prescriptions   Pending Prescriptions Disp Refills  . carvedilol (COREG) 12.5 MG tablet 60 tablet 2    Sig: Take 1 tablet (12.5 mg total) by mouth 2 (two) times daily with a meal.

## 2011-10-09 ENCOUNTER — Encounter: Payer: Self-pay | Admitting: Physician Assistant

## 2011-10-09 ENCOUNTER — Ambulatory Visit: Payer: Medicare Other | Admitting: Cardiology

## 2011-10-09 ENCOUNTER — Ambulatory Visit (INDEPENDENT_AMBULATORY_CARE_PROVIDER_SITE_OTHER): Payer: Medicare Other | Admitting: Physician Assistant

## 2011-10-09 VITALS — BP 97/55 | HR 74 | Ht 70.0 in | Wt 204.0 lb

## 2011-10-09 DIAGNOSIS — I447 Left bundle-branch block, unspecified: Secondary | ICD-10-CM

## 2011-10-09 DIAGNOSIS — I509 Heart failure, unspecified: Secondary | ICD-10-CM

## 2011-10-09 MED ORDER — CARVEDILOL 12.5 MG PO TABS: 12.5000 mg | ORAL_TABLET | Freq: Two times a day (BID) | ORAL | Status: DC

## 2011-10-09 NOTE — Patient Instructions (Addendum)
Your physician wants you to follow-up in: 1 YEAR WITH DR. HOCHREIN. You will receive a reminder letter in the mail two months in advance. If you don't receive a letter, please call our office to schedule the follow-up appointment.   COREG REFILL SENT IN TODAY TO [facility]

## 2011-10-09 NOTE — Progress Notes (Signed)
[address] [place], [place]  [address] Phone: [phone] Fax:  [phone]  Date:  10/09/2011   Name:  [name] [name]   DOB:  Dec 14, 1930   MRN:  [id number]  PCP:  Judie Petit, MD  Primary Cardiologist:  Dr. Rollene Rotunda  Primary Electrophysiologist:  None    History of Present Illness: [name] [name] is a 78 y.o. male who returns for f/u.  He has a hx of systolic CHF, NICM, LBBB, prior DVT, HTN, HL, prostate CA.  LHC 11/2003: pLAD 25%, oAV CFX 25%, mRCA 25%, EF 40%.  Echo 9/07:  EF 40-50%, mild LVH, mild to moderate MAC, moderate LAE.  MUGA 2005: EF 39%.  Hoter 2006: PAC, sinus tach, NSR.  Nuclear study 2005: no ischemia, apical thinning, EF 45%.  Last seen by Dr. Rollene Rotunda 12/2009.    He is overall doing well.  Denies CP.  Has dyspnea with more extreme activities.  Describes Class II symptoms.  No significant change.  No orthopnea, PND, edema.  No syncope.    Wt Readings from Last 3 Encounters:  10/09/11 204 lb (92.534 kg)  07/09/11 205 lb (92.987 kg)  06/25/11 205 lb 4.8 oz (93.123 kg)     Past Medical History  Diagnosis Date  . Colon polyps   . Diverticulosis of colon   . Chronic systolic heart failure     NICM:  LHC 11/2003: pLAD 25%, oAV CFX 25%, mRCA 25%, EF 40%.  Echo 9/07:  EF 40-50%, mild LVH, mild to moderate MAC, moderate LAE.  MUGA 2005: EF 39%.  Hoter 2006: PAC, sinus tach, NSR.  Nuclear study 2005: no ischemia, apical thinning, EF 45%  . Varicose vein   . LBBB (left bundle branch block)   . DVT (deep venous thrombosis)   . Hypertension   . Hyperlipidemia   . History of prostate cancer   . Prostate cancer 2008    Current Outpatient Prescriptions  Medication Sig Dispense Refill  . carvedilol (COREG) 12.5 MG tablet Take 1 tablet (12.5 mg total) by mouth 2 (two) times daily with a meal.  60 tablet  2  . co-enzyme Q-10 30 MG capsule Take 30 mg by mouth 3 (three) times daily.        Marland Kitchen ezetimibe-simvastatin (VYTORIN) 10-40 MG  per tablet Take 1 tablet by mouth at bedtime.  90 tablet  3  . fish oil-omega-3 fatty acids 1000 MG capsule Take 2 g by mouth daily.        Marland Kitchen HYDROcodone-acetaminophen (NORCO) 5-325 MG per tablet Take 1 tablet by mouth 2 (two) times daily as needed for pain.  30 tablet  1  . Multiple Vitamins-Minerals (ALIVE MENS ENERGY PO) Take by mouth daily.        Allergies: Allergies  Allergen Reactions  . Valsartan     REACTION: passes out    History  Substance Use Topics  . Smoking status: Never Smoker   . Smokeless tobacco: Never Used  . Alcohol Use: 8.4 oz/week    14 Glasses of wine per week     ROS:  Please see the history of present illness.   Has an occasional cough.  All other systems reviewed and negative.   PHYSICAL EXAM: VS:  BP 97/55  Pulse 74  Ht 5\' 10"  (1.778 m)  Wt 204 lb (92.534 kg)  BMI 29.27 kg/m2 Well nourished, well developed, in no acute distress HEENT: normal Neck: no JVD Vascular: no carotid bruits Cardiac:  normal S1, S2; RRR; no murmur Lungs:  Right basilar crackles, no wheezing, rhonchi or rales Abd: soft, nontender, no hepatomegaly Ext: no edema Skin: warm and dry Neuro:  CNs 2-12 intact, no focal abnormalities noted  EKG:  Sinus rhythm, rate 64, left bundle branch block   ASSESSMENT AND PLAN:  1.  Chronic Systolic Congestive Heart Failure Volume stable.  Continue current medical regimen.  F/u with Dr. Rollene Rotunda in one year.  2.  Hyperlipidemia Managed by PCP.   3.  Abnormal Lung Exam Evaluated by CXR in 12/2009.  Findings were c/w pulmonary fibrosis.  Findings were stable and no changes.  No symptoms to suggest need for repeat CXR.   Luna Glasgow, PA-C  4:09 PM 10/09/2011

## 2012-01-08 ENCOUNTER — Other Ambulatory Visit: Payer: Self-pay | Admitting: *Deleted

## 2012-01-08 MED ORDER — EZETIMIBE-SIMVASTATIN 10-40 MG PO TABS: 1.0000 | ORAL_TABLET | Freq: Every day | ORAL | Status: DC

## 2012-01-27 ENCOUNTER — Encounter: Payer: Self-pay | Admitting: Internal Medicine

## 2012-02-12 ENCOUNTER — Other Ambulatory Visit: Payer: Self-pay | Admitting: *Deleted

## 2012-02-12 MED ORDER — VALACYCLOVIR HCL 1 G PO TABS: 1000.0000 mg | ORAL_TABLET | Freq: Two times a day (BID) | ORAL | Status: DC

## 2012-02-13 ENCOUNTER — Ambulatory Visit (INDEPENDENT_AMBULATORY_CARE_PROVIDER_SITE_OTHER): Payer: Medicare Other | Admitting: Internal Medicine

## 2012-02-13 VITALS — BP 112/72 | Temp 97.6°F

## 2012-02-13 DIAGNOSIS — B029 Zoster without complications: Secondary | ICD-10-CM

## 2012-02-13 MED ORDER — HYDROCODONE-ACETAMINOPHEN 5-500 MG PO TABS: 1.0000 | ORAL_TABLET | Freq: Four times a day (QID) | ORAL | Status: DC | PRN

## 2012-02-16 ENCOUNTER — Encounter: Payer: Self-pay | Admitting: Internal Medicine

## 2012-02-16 DIAGNOSIS — B029 Zoster without complications: Secondary | ICD-10-CM

## 2012-02-16 HISTORY — DX: Zoster without complications: B02.9

## 2012-02-16 NOTE — Progress Notes (Signed)
Patient ID: [name] [name], male   DOB: [birth year], 78 y.o.   MRN: [id number] Painful rash- left side of neck for 1 day  Reviewed pmh, psh, soc hx  Exam: herpetiform rash in dermatomal patter (upper c-spine)

## 2012-02-16 NOTE — Assessment & Plan Note (Signed)
Started valtrex yesterday Discussed natural course with patient and wife  Add hydrocodone

## 2012-03-20 ENCOUNTER — Other Ambulatory Visit: Payer: Self-pay | Admitting: Internal Medicine

## 2012-06-06 ENCOUNTER — Other Ambulatory Visit: Payer: Self-pay

## 2012-07-31 ENCOUNTER — Ambulatory Visit (INDEPENDENT_AMBULATORY_CARE_PROVIDER_SITE_OTHER): Payer: Medicare Other | Admitting: Internal Medicine

## 2012-07-31 ENCOUNTER — Encounter: Payer: Self-pay | Admitting: Internal Medicine

## 2012-07-31 VITALS — BP 122/84 | HR 68 | Temp 97.8°F | Ht 70.5 in | Wt 196.0 lb

## 2012-07-31 DIAGNOSIS — R0609 Other forms of dyspnea: Secondary | ICD-10-CM

## 2012-07-31 DIAGNOSIS — Z8546 Personal history of malignant neoplasm of prostate: Secondary | ICD-10-CM

## 2012-07-31 DIAGNOSIS — R269 Unspecified abnormalities of gait and mobility: Secondary | ICD-10-CM

## 2012-07-31 DIAGNOSIS — I10 Essential (primary) hypertension: Secondary | ICD-10-CM

## 2012-07-31 DIAGNOSIS — E785 Hyperlipidemia, unspecified: Secondary | ICD-10-CM

## 2012-07-31 DIAGNOSIS — R0989 Other specified symptoms and signs involving the circulatory and respiratory systems: Secondary | ICD-10-CM

## 2012-07-31 DIAGNOSIS — I509 Heart failure, unspecified: Secondary | ICD-10-CM

## 2012-07-31 DIAGNOSIS — R0600 Dyspnea, unspecified: Secondary | ICD-10-CM

## 2012-07-31 LAB — CBC WITH DIFFERENTIAL/PLATELET
Basophils Relative: 0.5 % (ref 0.0–3.0)
Eosinophils Absolute: 0.3 10*3/uL (ref 0.0–0.7)
Eosinophils Relative: 4.4 % (ref 0.0–5.0)
Hemoglobin: 13.9 g/dL (ref 13.0–17.0)
Lymphocytes Relative: 24.2 % (ref 12.0–46.0)
MCHC: 33.8 g/dL (ref 30.0–36.0)
MCV: 93.1 fl (ref 78.0–100.0)
Neutro Abs: 4.1 10*3/uL (ref 1.4–7.7)
RBC: 4.43 Mil/uL (ref 4.22–5.81)
WBC: 6.8 10*3/uL (ref 4.5–10.5)

## 2012-07-31 LAB — BASIC METABOLIC PANEL
BUN: 19 mg/dL (ref 6–23)
Chloride: 100 mEq/L (ref 96–112)
Glucose, Bld: 89 mg/dL (ref 70–99)
Potassium: 4.7 mEq/L (ref 3.5–5.1)

## 2012-07-31 LAB — HEPATIC FUNCTION PANEL
ALT: 33 U/L (ref 0–53)
AST: 35 U/L (ref 0–37)
Alkaline Phosphatase: 53 U/L (ref 39–117)
Total Bilirubin: 0.5 mg/dL (ref 0.3–1.2)

## 2012-07-31 LAB — TSH: TSH: 2.17 u[IU]/mL (ref 0.35–5.50)

## 2012-07-31 LAB — LIPID PANEL
HDL: 55.9 mg/dL (ref >39.00)
LDL Cholesterol: 81 mg/dL (ref 0–99)
Total CHOL/HDL Ratio: 3
Triglycerides: 95 mg/dL (ref 0.0–149.0)
VLDL: 19 mg/dL (ref 0.0–40.0)

## 2012-07-31 MED ORDER — EZETIMIBE-SIMVASTATIN 10-40 MG PO TABS: 1.0000 | ORAL_TABLET | Freq: Every day | ORAL | Status: DC

## 2012-07-31 NOTE — Progress Notes (Signed)
Patient ID: [name] [name], male   DOB: 02-May-1930, 79 y.o.   MRN: [id number] Notes gait is unsteady  Notes some SOB at times. He can stay on a recumbent bikd for 30-11min without trouble. But walkingon a slight incline can cause SOB. Walking on flat ground without trouble and also more steady  Patient has a history of cardiomyopathy. Review previous echocardiogram.  Hypertension: Patient does not monitor blood pressure at home.  Past Medical History  Diagnosis Date  . Colon polyps   . Diverticulosis of colon   . Chronic systolic heart failure     NICM:  LHC 11/2003: pLAD 25%, oAV CFX 25%, mRCA 25%, EF 40%.  Echo 9/07:  EF 40-50%, mild LVH, mild to moderate MAC, moderate LAE.  MUGA 2005: EF 39%.  Hoter 2006: PAC, sinus tach, NSR.  Nuclear study 2005: no ischemia, apical thinning, EF 45%  . Varicose vein   . LBBB (left bundle branch block)   . DVT (deep venous thrombosis)   . Hypertension   . Hyperlipidemia   . History of prostate cancer   . Prostate cancer 2008  . Shingles 02/16/2012    History   Social History  . Marital Status: Married    Spouse Name: N/A    Number of Children: N/A  . Years of Education: N/A   Occupational History  . Not on file.   Social History Main Topics  . Smoking status: Never Smoker   . Smokeless tobacco: Never Used  . Alcohol Use: 8.4 oz/week    14 Glasses of wine per week  . Drug Use: No  . Sexually Active: Not on file   Other Topics Concern  . Not on file   Social History Narrative  . No narrative on file    Past Surgical History  Procedure Laterality Date  . Appendectomy    . Cervical laminectomy    . Total hip arthroplasty      bilateral    Family History  Problem Relation Age of Onset  . Diabetes Father   . Colon cancer Neg Hx   . Stomach cancer Neg Hx     Allergies  Allergen Reactions  . Valsartan     REACTION: passes out    Current Outpatient Prescriptions on File Prior to Visit  Medication Sig Dispense Refill  .  carvedilol (COREG) 12.5 MG tablet Take 1 tablet (12.5 mg total) by mouth 2 (two) times daily with a meal.  180 tablet  3  . co-enzyme Q-10 30 MG capsule Take 30 mg by mouth 3 (three) times daily.        Marland Kitchen HYDROcodone-acetaminophen (VICODIN) 5-500 MG per tablet Take 1 tablet by mouth every 6 (six) hours as needed for pain.  40 tablet  0  . Multiple Vitamins-Minerals (ALIVE MENS ENERGY PO) Take by mouth daily.       No current facility-administered medications on file prior to visit.     patient denies chest pain, shortness of breath, orthopnea. Denies lower extremity edema, abdominal pain, change in appetite, change in bowel movements. Patient denies rashes, musculoskeletal complaints. No other specific complaints in a complete review of systems.   BP 122/84  Pulse 68  Temp(Src) 97.8 F (36.6 C) (Oral)  Ht 5' 10.5" (1.791 m)  Wt 196 lb (88.905 kg)  BMI 27.72 kg/m2   well-developed well-nourished male in no acute distress. HEENT exam atraumatic, normocephalic, neck supple without jugular venous distention. Chest clear to auscultation cardiac exam S1-S2 are regular.  Abdominal exam overweight with bowel sounds, soft and nontender. Extremities no edema. Neurologic exam is alert with a normal gait.   Assessment and plan:  Gait instability. I suspect this is multifactorial. I suspect there is some age related contribution. I also think that his multiple joint replacements and dislocations have caused some problems. I'm concerned that he may fall. Refer to physical therapy for fall prevention.

## 2012-08-01 ENCOUNTER — Encounter: Payer: Self-pay | Admitting: Internal Medicine

## 2012-08-01 NOTE — Assessment & Plan Note (Signed)
He has regular followup with urologist.

## 2012-08-01 NOTE — Assessment & Plan Note (Signed)
I reviewed last echocardiogram in 2007. I think he needs to have another echocardiogram. I doubt that his dyspnea is related to congestive heart failure but it certainly is possible. Needs evaluation. We'll continue current medications for the time being.

## 2012-08-01 NOTE — Assessment & Plan Note (Signed)
Will check laboratory work today. Continue current medications for now.

## 2012-08-01 NOTE — Assessment & Plan Note (Signed)
BP Readings from Last 3 Encounters:  07/31/12 122/84  02/13/12 112/72  10/09/11 97/55   Adequately controlled. Continue current medications. Note that he has lost some weight.

## 2012-08-07 ENCOUNTER — Ambulatory Visit (HOSPITAL_COMMUNITY): Payer: Medicare Other | Attending: Internal Medicine | Admitting: Radiology

## 2012-08-07 ENCOUNTER — Other Ambulatory Visit: Payer: Self-pay

## 2012-08-07 DIAGNOSIS — I509 Heart failure, unspecified: Secondary | ICD-10-CM

## 2012-08-07 DIAGNOSIS — I428 Other cardiomyopathies: Secondary | ICD-10-CM | POA: Insufficient documentation

## 2012-08-07 DIAGNOSIS — R0600 Dyspnea, unspecified: Secondary | ICD-10-CM

## 2012-08-07 DIAGNOSIS — R0989 Other specified symptoms and signs involving the circulatory and respiratory systems: Secondary | ICD-10-CM | POA: Insufficient documentation

## 2012-08-07 DIAGNOSIS — R0609 Other forms of dyspnea: Secondary | ICD-10-CM | POA: Insufficient documentation

## 2012-08-07 NOTE — Progress Notes (Signed)
Echocardiogram performed.  

## 2012-08-11 ENCOUNTER — Ambulatory Visit: Payer: Medicare Other | Attending: Internal Medicine

## 2012-08-11 DIAGNOSIS — R262 Difficulty in walking, not elsewhere classified: Secondary | ICD-10-CM | POA: Insufficient documentation

## 2012-08-11 DIAGNOSIS — M6281 Muscle weakness (generalized): Secondary | ICD-10-CM | POA: Insufficient documentation

## 2012-08-11 DIAGNOSIS — R269 Unspecified abnormalities of gait and mobility: Secondary | ICD-10-CM | POA: Insufficient documentation

## 2012-08-13 ENCOUNTER — Ambulatory Visit: Payer: Medicare Other

## 2012-08-18 ENCOUNTER — Ambulatory Visit: Payer: Medicare Other

## 2012-08-20 ENCOUNTER — Ambulatory Visit: Payer: Medicare Other | Attending: Internal Medicine | Admitting: Physical Therapy

## 2012-08-20 DIAGNOSIS — R269 Unspecified abnormalities of gait and mobility: Secondary | ICD-10-CM | POA: Insufficient documentation

## 2012-08-20 DIAGNOSIS — Z96649 Presence of unspecified artificial hip joint: Secondary | ICD-10-CM | POA: Insufficient documentation

## 2012-08-20 DIAGNOSIS — R262 Difficulty in walking, not elsewhere classified: Secondary | ICD-10-CM | POA: Insufficient documentation

## 2012-08-20 DIAGNOSIS — M6281 Muscle weakness (generalized): Secondary | ICD-10-CM | POA: Insufficient documentation

## 2012-08-20 DIAGNOSIS — IMO0001 Reserved for inherently not codable concepts without codable children: Secondary | ICD-10-CM | POA: Insufficient documentation

## 2012-08-25 ENCOUNTER — Ambulatory Visit: Payer: Medicare Other

## 2012-08-27 ENCOUNTER — Ambulatory Visit: Payer: Medicare Other | Admitting: Physical Therapy

## 2012-09-01 ENCOUNTER — Ambulatory Visit: Payer: Medicare Other

## 2012-09-03 ENCOUNTER — Ambulatory Visit: Payer: Medicare Other

## 2012-09-03 ENCOUNTER — Other Ambulatory Visit: Payer: Self-pay | Admitting: Physician Assistant

## 2012-09-03 NOTE — Telephone Encounter (Signed)
..   Requested Prescriptions   Pending Prescriptions Disp Refills  . carvedilol (COREG) 12.5 MG tablet [Pharmacy Med Name: CARVEDILOL TABS 12.5MG ] 180 tablet 2    Sig: TAKE 1 TABLET BY MOUTH 2 TIMES A DAY WITH MEALS

## 2012-09-08 ENCOUNTER — Ambulatory Visit: Payer: Medicare Other

## 2012-09-10 ENCOUNTER — Ambulatory Visit: Payer: Medicare Other

## 2012-09-15 ENCOUNTER — Ambulatory Visit: Payer: Medicare Other

## 2012-09-17 ENCOUNTER — Ambulatory Visit: Payer: Medicare Other

## 2012-09-29 ENCOUNTER — Ambulatory Visit: Payer: Medicare Other | Attending: Internal Medicine

## 2012-09-29 DIAGNOSIS — Z96649 Presence of unspecified artificial hip joint: Secondary | ICD-10-CM | POA: Insufficient documentation

## 2012-09-29 DIAGNOSIS — M6281 Muscle weakness (generalized): Secondary | ICD-10-CM | POA: Insufficient documentation

## 2012-09-29 DIAGNOSIS — IMO0001 Reserved for inherently not codable concepts without codable children: Secondary | ICD-10-CM | POA: Insufficient documentation

## 2012-09-29 DIAGNOSIS — R269 Unspecified abnormalities of gait and mobility: Secondary | ICD-10-CM | POA: Insufficient documentation

## 2012-09-29 DIAGNOSIS — R262 Difficulty in walking, not elsewhere classified: Secondary | ICD-10-CM | POA: Insufficient documentation

## 2012-10-06 ENCOUNTER — Ambulatory Visit: Payer: Medicare Other

## 2012-10-06 ENCOUNTER — Ambulatory Visit (INDEPENDENT_AMBULATORY_CARE_PROVIDER_SITE_OTHER)
Admission: RE | Admit: 2012-10-06 | Discharge: 2012-10-06 | Disposition: A | Discharge status: Home / Self Care | Payer: Medicare Other | Source: Ambulatory Visit | Attending: Cardiology | Admitting: Cardiology

## 2012-10-06 ENCOUNTER — Encounter: Payer: Self-pay | Admitting: Cardiology

## 2012-10-06 ENCOUNTER — Ambulatory Visit (INDEPENDENT_AMBULATORY_CARE_PROVIDER_SITE_OTHER): Payer: Medicare Other | Admitting: Cardiology

## 2012-10-06 VITALS — BP 100/60 | HR 59 | Ht 70.0 in | Wt 196.0 lb

## 2012-10-06 DIAGNOSIS — R0602 Shortness of breath: Secondary | ICD-10-CM

## 2012-10-06 DIAGNOSIS — I509 Heart failure, unspecified: Secondary | ICD-10-CM

## 2012-10-06 NOTE — Progress Notes (Signed)
HPI The patient presents for evaluation of dyspnea. He saw Dr. Cato Mulligan recently. He was having a little more dyspnea. He did have a followup echocardiogram. This actually suggested the ejection fraction to be normal though it has been slightly low in the past. He says he does get short of breath with some activity such as walking [place] an incl[place] on a [place]. This is probably slowly progressive. He does not think it's been an acute decline in his does not describe PND or orthopnea. He has a slight cough that is less now than it was in the spring. He has had no chest pressure, neck or arm discomfort. He doesn't describe any palpitations, presyncope or syncope. He has had no weight gain or edema.  Allergies  Allergen Reactions  . Valsartan     REACTION: passes out    Current Outpatient Prescriptions  Medication Sig Dispense Refill  . co-enzyme Q-10 30 MG capsule Take 30 mg by mouth 3 (three) times daily.        Marland Kitchen HYDROcodone-acetaminophen (VICODIN) 5-500 MG per tablet Take 1 tablet by mouth every 6 (six) hours as needed for pain.  40 tablet  0  . Multiple Vitamins-Minerals (ALIVE MENS ENERGY PO) Take by mouth daily.      . carvedilol (COREG) 12.5 MG tablet TAKE 1 TABLET BY MOUTH 2 TIMES A DAY WITH MEALS  180 tablet  2  . ezetimibe-simvastatin (VYTORIN) 10-40 MG per tablet Take 1 tablet by mouth at bedtime.  90 tablet  3   No current facility-administered medications for this visit.    Past Medical History  Diagnosis Date  . Colon polyps   . Diverticulosis of colon   . Chronic systolic heart failure     NICM:  LHC 11/2003: pLAD 25%, oAV CFX 25%, mRCA 25%, EF 40%.  Echo 9/07:  EF 40-50%, mild LVH, mild to moderate MAC, moderate LAE.  MUGA 2005: EF 39%.  Hoter 2006: PAC, sinus tach, NSR.  Nuclear study 2005: no ischemia, apical thinning, EF 45%  . Varicose vein   . LBBB (left bundle branch block)   . DVT (deep venous thrombosis)   . Hypertension   . Hyperlipidemia   . History of  prostate cancer   . Prostate cancer 2008  . Shingles 02/16/2012    Past Surgical History  Procedure Laterality Date  . Appendectomy    . Cervical laminectomy    . Total hip arthroplasty      bilateral    ROS:  As stated in the HPI and negative for all other systems.  PHYSICAL EXAM BP 100/60  Pulse 59  Ht 5\' 10"  (1.778 m)  Wt 196 lb (88.905 kg)  BMI 28.12 kg/m2 GENERAL:  Well appearing HEENT:  Pupils equal round and reactive, fundi not visualized, oral mucosa unremarkable NECK:  No jugular venous distention, waveform within normal limits, carotid upstroke brisk and symmetric, no bruits, no thyromegaly LYMPHATICS:  No cervical, inguinal adenopathy LUNGS:  Mild fine basilar crackles bilaterally BACK:  No CVA tenderness CHEST:  Unremarkable HEART:  PMI not displaced or sustained,S1 and S2 within normal limits, no S3, no S4, no clicks, no rubs, no murmurs ABD:  Flat, positive bowel sounds normal in frequency in pitch, no bruits, no rebound, no guarding, no midline pulsatile mass, no hepatomegaly, no splenomegaly EXT:  2 plus pulses throughout, trace ankle edema, no cyanosis no clubbing SKIN:  No rashes no nodules NEURO:  Cranial nerves II through XII grossly intact, motor  grossly intact throughout [facility]:  Cognitively intact, oriented to person place and time  EKG:  Sinus rhythm, rate 59, left bundle branch block, left axis deviation, no change from previous. 10/06/2012  ASSESSMENT AND PLAN  DOE:  This has been mildly progressive. I would like to check a BNP level and given the mild fibrosis on chest x-ray in 2011 I will repeat a chest x-ray. However, it does not appear that systolic heart failure is an etiology to this complaint.  CAD:  He has had nonobstructive disease in the past. At this point I don't think the above complaints is an ischemic equivalent. We will continue management with risk reduction.  HTN:  The blood pressure is at target. No change in medications is  indicated. We will continue with therapeutic lifestyle changes (TLC).

## 2012-10-06 NOTE — Patient Instructions (Addendum)
The current medical regimen is effective;  continue present plan and medications.  Please have blood work today.  A chest x-ray takes a picture of the organs and structures inside the chest, including the heart, lungs, and blood vessels. This test can show several things, including, whether the heart is enlarges; whether fluid is building up in the lungs; and whether pacemaker / defibrillator leads are still in place.  Follow up in 1 year with Dr Antoine Poche.  You will receive a letter in the mail 2 months before you are due.  Please call us when you receive this letter to schedule your follow up appointment.

## 2012-10-07 LAB — BRAIN NATRIURETIC PEPTIDE: Pro B Natriuretic peptide (BNP): 110 pg/mL — ABNORMAL HIGH (ref 0.0–100.0)

## 2012-10-13 ENCOUNTER — Ambulatory Visit: Payer: Medicare Other

## 2012-10-27 ENCOUNTER — Ambulatory Visit: Payer: Medicare Other

## 2012-11-03 ENCOUNTER — Ambulatory Visit: Payer: Medicare Other | Attending: Internal Medicine

## 2012-11-03 DIAGNOSIS — M6281 Muscle weakness (generalized): Secondary | ICD-10-CM | POA: Insufficient documentation

## 2012-11-03 DIAGNOSIS — Z96649 Presence of unspecified artificial hip joint: Secondary | ICD-10-CM | POA: Insufficient documentation

## 2012-11-03 DIAGNOSIS — R269 Unspecified abnormalities of gait and mobility: Secondary | ICD-10-CM | POA: Insufficient documentation

## 2012-11-03 DIAGNOSIS — R262 Difficulty in walking, not elsewhere classified: Secondary | ICD-10-CM | POA: Insufficient documentation

## 2012-11-03 DIAGNOSIS — IMO0001 Reserved for inherently not codable concepts without codable children: Secondary | ICD-10-CM | POA: Insufficient documentation

## 2013-05-06 ENCOUNTER — Ambulatory Visit (INDEPENDENT_AMBULATORY_CARE_PROVIDER_SITE_OTHER): Payer: Medicare Other | Admitting: Family Medicine

## 2013-05-06 ENCOUNTER — Encounter: Payer: Self-pay | Admitting: Family Medicine

## 2013-05-06 VITALS — BP 124/70 | HR 79 | Temp 97.3°F | Wt 194.0 lb

## 2013-05-06 DIAGNOSIS — R053 Chronic cough: Secondary | ICD-10-CM

## 2013-05-06 DIAGNOSIS — R05 Cough: Secondary | ICD-10-CM

## 2013-05-06 DIAGNOSIS — R059 Cough, unspecified: Secondary | ICD-10-CM

## 2013-05-06 MED ORDER — TRAMADOL HCL 50 MG PO TABS: 50.0000 mg | ORAL_TABLET | Freq: Four times a day (QID) | ORAL | Status: DC | PRN

## 2013-05-06 NOTE — Patient Instructions (Signed)
We will call you regarding pulmonary referral.

## 2013-05-06 NOTE — Progress Notes (Signed)
Pre visit review using our clinic review tool, if applicable. No additional management support is needed unless otherwise documented below in the visit note. 

## 2013-05-06 NOTE — Progress Notes (Signed)
   Subjective:    Patient ID: [name] [name], male    DOB: 05-Jun-1930, 80 y.o.   MRN: [id number]  HPI Seen for chronic cough. Nonsmoker. Duration of cough 3 months. This is mostly dry and rarely productive. He has had some relatively mild dyspnea but is still exercising without difficulty. He has had history of hypertension and remote history of prostate cancer. He is trying to lose weight over the past year but has not had any change in appetite. His cough is intermittent. Sometimes severe. Never smoked.    He denies any postnasal drip, GERD, ACE inhibitor use, history of asthma, or any appetite or weight changes. No hemoptysis. No pleuritic pain. Never smoked. Used some type of over-the-counter cough lozenges with minimal improvement.  Patient had chest x-ray per cardiology June 2014- "old granulomatous disease and pulmonary fibrosis with no acute findings".  He is not aware of any past history of occupational exposure that would put him at risk for interstitial lung disease. He does have history of systolic heart failure but denies any orthopnea.    Past Medical History  Diagnosis Date  . Colon polyps   . Diverticulosis of colon   . Chronic systolic heart failure     NICM:  LHC 11/2003: pLAD 25%, oAV CFX 25%, mRCA 25%, EF 40%.  Echo 9/07:  EF 40-50%, mild LVH, mild to moderate MAC, moderate LAE.  MUGA 2005: EF 39%.  Hoter 2006: PAC, sinus tach, NSR.  Nuclear study 2005: no ischemia, apical thinning, EF 45%  . Varicose vein   . LBBB (left bundle branch block)   . DVT (deep venous thrombosis)   . Hypertension   . Hyperlipidemia   . History of prostate cancer   . Prostate cancer 2008  . Shingles 02/16/2012   Past Surgical History  Procedure Laterality Date  . Appendectomy    . Cervical laminectomy    . Total hip arthroplasty      bilateral    reports that he has never smoked. He has never used smokeless tobacco. He reports that he drinks about 8.4 ounces of alcohol per week. He  reports that he does not use illicit drugs. family history includes Diabetes in his father. There is no history of Colon cancer or Stomach cancer. Allergies  Allergen Reactions  . Valsartan     REACTION: passes out      Review of Systems  Constitutional: Negative for fever, chills and unexpected weight change.  Respiratory: Positive for cough and shortness of breath. Negative for wheezing.   Cardiovascular: Negative for chest pain, palpitations and leg swelling.  Neurological: Negative for dizziness.       Objective:   Physical Exam  Constitutional: He appears well-developed and well-nourished.  Neck: Neck supple. No thyromegaly present.  Cardiovascular: Normal rate.   Pulmonary/Chest: Effort normal.  Patient has some prominent crackles in both bases. No wheezes.  Musculoskeletal: He exhibits no edema.  Lymphadenopathy:    He has no cervical adenopathy.          Assessment & Plan:  Chronic cough of several months duration. Chest x-ray in June as above. Suspect he has chronic interstitial lung process.  Recommend pulmonary referral for further evaluation. In the meantime, wrote for low dose tramadol 50 mg every 6 hours to see if this takes the edge off his cough. He does not have any obvious GERD or postnasal drip symptoms. He has some chronic dyspnea which is stable and unchanged.

## 2013-05-19 ENCOUNTER — Ambulatory Visit (INDEPENDENT_AMBULATORY_CARE_PROVIDER_SITE_OTHER): Payer: Medicare Other | Admitting: Internal Medicine

## 2013-05-19 ENCOUNTER — Ambulatory Visit (INDEPENDENT_AMBULATORY_CARE_PROVIDER_SITE_OTHER)
Admission: RE | Admit: 2013-05-19 | Discharge: 2013-05-19 | Disposition: A | Discharge status: Home / Self Care | Payer: Medicare Other | Source: Ambulatory Visit | Attending: Internal Medicine | Admitting: Internal Medicine

## 2013-05-19 ENCOUNTER — Encounter: Payer: Self-pay | Admitting: Internal Medicine

## 2013-05-19 VITALS — BP 112/70 | HR 63 | Temp 97.4°F | Ht 69.0 in | Wt 191.0 lb

## 2013-05-19 DIAGNOSIS — J8410 Pulmonary fibrosis, unspecified: Secondary | ICD-10-CM

## 2013-05-19 MED ORDER — TRAMADOL HCL 50 MG PO TABS: 50.0000 mg | ORAL_TABLET | ORAL | Status: DC | PRN

## 2013-05-19 MED ORDER — FAMOTIDINE 20 MG PO TABS: ORAL_TABLET | ORAL | Status: DC

## 2013-05-19 MED ORDER — PANTOPRAZOLE SODIUM 40 MG PO TBEC: 40.0000 mg | DELAYED_RELEASE_TABLET | Freq: Every day | ORAL | Status: DC

## 2013-05-19 NOTE — Patient Instructions (Addendum)
Pantoprazole (protonix) 40 mg   Take 30-60 min before first meal of the day and Pepcid 20 mg one bedtime until return to office - this is the best way to tell whether stomach acid is contributing to your problem.    GERD (REFLUX)  is an extremely common cause of respiratory symptoms, many times with no significant heartburn at all.    It can be treated with medication, but also with lifestyle changes including avoidance of late meals, excessive alcohol, smoking cessation, and avoid fatty foods, chocolate, peppermint, colas, red wine, and acidic juices such as orange juice.  NO MINT OR MENTHOL PRODUCTS SO NO COUGH DROPS  USE SUGARLESS CANDY INSTEAD (jolley ranchers or Stover's)  NO OIL BASED VITAMINS - use powdered substitutes.  Take delsym two tsp every 12 hours and supplement if needed with  tramadol 50 mg up to 1 every 4 hours to suppress the urge to cough. Swallowing water or using ice chips/non mint and menthol containing candies (such as lifesavers or sugarless jolly ranchers) are also effective.  You should rest your voice and avoid activities that you know make you cough.  Once you have eliminated the cough for 3 straight days try reducing the tramadol first,  then the delsym as tolerated.    Please remember to go to the  x-ray department downstairs for your tests - we will call you with the results when they are available.    Please schedule a follow up office visit in 4 weeks, sooner if needed with pfts Add old pulmonary eval requested

## 2013-05-19 NOTE — Assessment & Plan Note (Signed)
-   present on CT chest 2005  He does not have clubbing nor def progression x 10 y so this probably not IPF/UIP but may be the cause of his refractory cough and reproducible doe so definitely needs w/u at this point.  In meantime, Use of PPI is associated with improved survival time and with decreased radiologic fibrosis per King's study published in [facility] vol 184 p1390.  Dec 2011  This may not be cause and effect, but given how universally unhelpful all the otherstudy drugs have been for pf,   rec start  rx ppi / diet/ lifestyle modification.    See instructions for specific recommendations which were reviewed directly with the patient who was given a copy with highlighter outlining the key components.

## 2013-05-19 NOTE — Progress Notes (Signed)
   Subjective:    Patient ID: [name] [name], male    DOB: 02/05/1931  MRN: [id number]  HPI  68 yowm never smoker eval in PUlmonary around 2006 for ? Reason referred by Dr Elease Hashimoto 05/19/2013 for pulmonary eval for cough x Oct 2014   05/19/2013 1st [facility] [facility] office visit/ Sheli Dorin cc indolent onset progressively worse daily non productive cough since Oct 2014 superimposed on doe x inclines x sev years   And assoc with hoarseness. Rarely at hs on cough syrup. Doesn't wake him up but rather p stirring x one half hour.  Tried cough drops no better  - no exp to chemo, amio, no RA   No obvious day to day or daytime variabilty or assoc  cp or chest tightness, subjective wheeze overt sinus or hb symptoms. No unusual exp hx or h/o childhood pna/ asthma or knowledge of premature birth.  Sleeping ok without nocturnal  or early am exacerbation  of respiratory  c/o's or need for noct saba. Also denies any obvious fluctuation of symptoms with weather or environmental changes or other aggravating or alleviating factors except as outlined above   Current Medications, Allergies, Complete Past Medical History, Past Surgical History, Family History, and Social History were reviewed in [employer] record.          Review of Systems  Constitutional: Negative for fever, chills, activity change, appetite change and unexpected weight change.  HENT: Negative for congestion, dental problem, postnasal drip, rhinorrhea, sneezing, sore throat, trouble swallowing and voice change.   Eyes: Negative for visual disturbance.  Respiratory: Positive for cough and shortness of breath. Negative for choking.   Cardiovascular: Negative for chest pain and leg swelling.  Gastrointestinal: Negative for nausea, vomiting and abdominal pain.  Genitourinary: Negative for difficulty urinating.  Musculoskeletal: Negative for arthralgias.  Skin: Negative for rash.  Psychiatric/Behavioral: Negative for behavioral  problems and confusion.       Objective:   Physical Exam  amb very hoarse wm freq throat clearing   Wt Readings from Last 3 Encounters:  05/19/13 191 lb (86.637 kg)  05/06/13 194 lb (87.998 kg)  10/06/12 196 lb (88.905 kg)      HEENT: nl dentition, turbinates, and orophanx. Nl external ear canals without cough reflex   NECK :  without JVD/Nodes/TM/ nl carotid upstrokes bilaterally   LUNGS: no acc muscle use, clear to A and P bilaterally with  Cough @ end   insp with a few insp crackles bilaterally    CV:  RRR  no s3 or murmur or increase in P2, no edema   ABD:  soft and nontender with nl excursion in the supine position. No bruits or organomegaly, bowel sounds nl  MS:  warm without deformities, calf tenderness, cyanosis or clubbing  SKIN: warm and dry without lesions    NEURO:  alert, approp, no deficits    CXR  05/19/2013 :    Allowing for differences in radiographic technique there has not been significant interval change in the appearance of the lungs. There remain bilaterally increased interstitial markings which suggests pulmonary fibrosis. Certainly superimposed acute bronchitis cannot be excluded. There is no alveolar pneumonia nor evidence of CHF.       Assessment & Plan:

## 2013-05-20 NOTE — Progress Notes (Signed)
Quick Note:  Spoke with pt and notified of results per Dr. Wert. Pt verbalized understanding and denied any questions.  ______ 

## 2013-06-09 ENCOUNTER — Other Ambulatory Visit: Payer: Self-pay | Admitting: Internal Medicine

## 2013-06-24 ENCOUNTER — Ambulatory Visit (INDEPENDENT_AMBULATORY_CARE_PROVIDER_SITE_OTHER): Payer: Medicare Other | Admitting: Internal Medicine

## 2013-06-24 ENCOUNTER — Encounter: Payer: Self-pay | Admitting: Internal Medicine

## 2013-06-24 ENCOUNTER — Ambulatory Visit (INDEPENDENT_AMBULATORY_CARE_PROVIDER_SITE_OTHER): Payer: Medicare Other

## 2013-06-24 VITALS — BP 84/52 | HR 53 | Temp 97.4°F | Ht 69.0 in | Wt 187.0 lb

## 2013-06-24 DIAGNOSIS — J8410 Pulmonary fibrosis, unspecified: Secondary | ICD-10-CM

## 2013-06-24 DIAGNOSIS — R0902 Hypoxemia: Secondary | ICD-10-CM | POA: Insufficient documentation

## 2013-06-24 LAB — PULMONARY FUNCTION TEST
DL/VA % PRED: 71 %
DL/VA: 3.21 ml/min/mmHg/L
DLCO UNC % PRED: 34 %
DLCO UNC: 10.57 ml/min/mmHg
FEF 25-75 Post: 3.01 L/sec
FEF 25-75 Pre: 2.86 L/sec
FEF2575-%Change-Post: 5 %
FEF2575-%PRED-PRE: 153 %
FEF2575-%Pred-Post: 160 %
FEV1-%Change-Post: 2 %
FEV1-%Pred-Post: 73 %
FEV1-%Pred-Pre: 71 %
FEV1-Post: 2 L
FEV1-Pre: 1.96 L
FEV1FVC-%Change-Post: 0 %
FEV1FVC-%Pred-Pre: 121 %
FEV6-%Change-Post: 3 %
FEV6-%PRED-PRE: 63 %
FEV6-%Pred-Post: 65 %
FEV6-POST: 2.34 L
FEV6-PRE: 2.26 L
FEV6FVC-%CHANGE-POST: 0 %
FEV6FVC-%PRED-POST: 107 %
FEV6FVC-%Pred-Pre: 106 %
FVC-%CHANGE-POST: 2 %
FVC-%PRED-POST: 60 %
FVC-%PRED-PRE: 59 %
FVC-POST: 2.34 L
FVC-Pre: 2.28 L
POST FEV6/FVC RATIO: 100 %
Post FEV1/FVC ratio: 85 %
Pre FEV1/FVC ratio: 86 %
Pre FEV6/FVC Ratio: 99 %
RV % pred: 63 %
RV: 1.65 L
TLC % PRED: 54 %
TLC: 3.73 L

## 2013-06-24 LAB — RHEUMATOID FACTOR: Rhuematoid fact SerPl-aCnc: <10 IU/mL (ref <=14)

## 2013-06-24 LAB — SEDIMENTATION RATE: Sed Rate: 36 mm/hr — ABNORMAL HIGH (ref 0–22)

## 2013-06-24 NOTE — Progress Notes (Signed)
Subjective:    Patient ID: [name] [name], male    DOB: 1930/09/02  MRN: [id number]    Brief patient profile:  67 yowm never smoker eval in PUlmonary around 2006 for ? Reason referred by Dr Elease Hashimoto 05/19/2013 for pulmonary eval for cough x Oct 2014    History of Present Illness  05/19/2013 1st [place] [facility] [facility] office visit/ Tammara Massing cc indolent onset progressively worse daily non productive cough since Oct 2014 superimposed on doe x inclines x sev years   And assoc with hoarseness. Rarely at hs on cough syrup. Doesn't wake him up but rather p stirring x one half hour.  Tried cough drops no better  - no exp to chemo, amio, no RA  rec Pantoprazole (protonix) 40 mg   Take 30-60 min before first meal of the day and Pepcid 20 mg one bedtime until return to office - this is the best way to tell whether stomach acid is contributing to your problem.   GERD diet  Take delsym two tsp every 12 hours and supplement if needed with  tramadol 50 mg up to 1 every 4 hours to suppress the urge to cough  Once you have eliminated the cough for 3 straight days try reducing the tramadol first,  then the delsym as tolerated.   Please schedule a follow up office visit in 4 weeks, sooner if needed with pfts Add old pulmonary eval requested   06/24/2013 f/u ov/[name] [name] re:  PF Chief Complaint  Patient presents with  . Followup with PFT    Pt states no change in DOE. His cough is minimally improved. No new co's today.    Able to able to do recumbent bike level 2 x 30 min daily x 6 months.  No obvious day to day or daytime variabilty or assoc chronic cough or cp or chest tightness, subjective wheeze overt sinus or hb symptoms. No unusual exp hx or h/o childhood pna/ asthma or knowledge of premature birth.  Sleeping ok without nocturnal  or early am exacerbation  of respiratory  c/o's or need for noct saba. Also denies any obvious fluctuation of symptoms with weather or environmental changes or other aggravating or  alleviating factors except as outlined above   Current Medications, Allergies, Complete Past Medical History, Past Surgical History, Family History, and Social History were reviewed in [employer] record.  ROS  The following are not active complaints unless bolded sore throat, dysphagia, dental problems, itching, sneezing,  nasal congestion or excess/ purulent secretions, ear ache,   fever, chills, sweats, unintended wt loss, pleuritic or exertional cp, hemoptysis,  orthopnea pnd or leg swelling, presyncope, palpitations, heartburn, abdominal pain, anorexia, nausea, vomiting, diarrhea  or change in bowel or urinary habits, change in stools or urine, dysuria,hematuria,  rash, arthralgias, visual complaints, headache, numbness weakness or ataxia or problems with walking or coordination,  change in mood/affect or memory.                         Objective:   Physical Exam  amb mild  hoarse wm no longer with  freq throat clearing   06/24/2013          187  Wt Readings from Last 3 Encounters:  05/19/13 191 lb (86.637 kg)  05/06/13 194 lb (87.998 kg)  10/06/12 196 lb (88.905 kg)      HEENT: nl dentition, turbinates, and orophanx. Nl external ear canals without cough reflex   NECK :  without JVD/Nodes/TM/ nl carotid upstrokes bilaterally   LUNGS: no acc muscle use, clear to A and P bilaterally with  Cough @ end   insp with a few insp crackles bilaterally    CV:  RRR  no s3 or murmur or increase in P2, no edema   ABD:  soft and nontender with nl excursion in the supine position. No bruits or organomegaly, bowel sounds nl  MS:  warm without deformities, calf tenderness, cyanosis or clubbing  SKIN: warm and dry without lesions       CXR  05/19/2013 :  Allowing for differences in radiographic technique there has not been significant interval change in the appearance of the lungs. There remain bilaterally increased interstitial markings which suggests  pulmonary fibrosis. Certainly superimposed acute bronchitis cannot be excluded. There is no alveolar pneumonia nor evidence of CHF.       Assessment & Plan:

## 2013-06-24 NOTE — Assessment & Plan Note (Signed)
Patient Saturations on Room Air at Rest = 93% Patient Saturations on [employer] while Ambulating = 83%> Patient Saturations on 2 Liters of oxygen while Ambulating = 92%  rx 2lpm with ex only for now

## 2013-06-24 NOTE — Progress Notes (Signed)
PFT done today. 

## 2013-06-24 NOTE — Assessment & Plan Note (Addendum)
-   present on CT chest 2005 - PFT's 06/24/2013 VC 2.08 (53%) without airflow obst and DLOC 34 corrects to 71 - 06/24/2013  desats ambulating corrected on 2lpm (see ex hypoxemia)   I had an extended discussion with the patient today lasting 15 to 20 minutes of a 25 minute visit on the following issues:   9 year hx of documented pf is unusual but certainly possible with UIP/IPF and he may be a candidate for one of the new fibroblast inhibitors but first need to develop at least one more point on the curve and in the meantime continue aggressive gerd rx, which appears to have at least helped his cough.  Also need to r/o collagen vasc dz  See instructions for specific recommendations which were reviewed directly with the patient who was given a copy with highlighter outlining the key components.

## 2013-06-24 NOTE — Patient Instructions (Addendum)
Please remember to go to the lab   department downstairs for your tests - we will call you with the results when they are available.  Please schedule a follow up visit in 6 months but call sooner if needed     

## 2013-06-25 ENCOUNTER — Encounter: Payer: Self-pay | Admitting: Internal Medicine

## 2013-06-25 LAB — ANTI-NUCLEAR AB-TITER (ANA TITER): ANA Titer 1: 1:40 {titer} — ABNORMAL HIGH

## 2013-06-25 LAB — CYCLIC CITRUL PEPTIDE ANTIBODY, IGG: Cyclic Citrullin Peptide Ab: <2 U/mL (ref 0.0–5.0)

## 2013-06-25 LAB — ANA: Anti Nuclear Antibody(ANA): POSITIVE — AB

## 2013-06-28 NOTE — Progress Notes (Signed)
Quick Note:  Spoke with pt and notified of results per Dr. Wert. Pt verbalized understanding and denied any questions.  ______ 

## 2013-08-20 ENCOUNTER — Other Ambulatory Visit: Payer: Self-pay | Admitting: Internal Medicine

## 2013-09-02 ENCOUNTER — Telehealth: Payer: Self-pay | Admitting: Internal Medicine

## 2013-09-02 MED ORDER — FAMOTIDINE 20 MG PO TABS: ORAL_TABLET | ORAL | Status: DC

## 2013-09-02 MED ORDER — PANTOPRAZOLE SODIUM 40 MG PO TBEC: 40.0000 mg | DELAYED_RELEASE_TABLET | Freq: Every day | ORAL | Status: DC

## 2013-09-02 NOTE — Telephone Encounter (Signed)
Spoke w/ pt. Aware will continue on these medications. Nothing further needed and rx;s sent

## 2013-10-09 ENCOUNTER — Encounter: Payer: Self-pay | Admitting: Internal Medicine

## 2013-10-11 ENCOUNTER — Ambulatory Visit: Payer: Medicare Other | Admitting: Cardiology

## 2013-10-14 ENCOUNTER — Encounter: Payer: Self-pay | Admitting: Cardiology

## 2013-10-14 ENCOUNTER — Ambulatory Visit (INDEPENDENT_AMBULATORY_CARE_PROVIDER_SITE_OTHER): Payer: Medicare Other | Admitting: Cardiology

## 2013-10-14 VITALS — BP 110/60 | HR 84 | Ht 69.0 in | Wt 189.0 lb

## 2013-10-14 DIAGNOSIS — I10 Essential (primary) hypertension: Secondary | ICD-10-CM

## 2013-10-14 DIAGNOSIS — I509 Heart failure, unspecified: Secondary | ICD-10-CM

## 2013-10-14 NOTE — Progress Notes (Signed)
HPI The patient presents for evaluation of dyspnea and previous cardiomyopathy.  He is seeing Dr. Melvyn Novas for possible postinflammatory pulmonary fibrosis. There was a suggestion that he needed to reduce his beta blocker. The patient said his breathing is relatively okay since I saw him. He is able to do some stationary/4 without bringing on any symptoms. He denies any chest pressure, neck or arm discomfort. He has had no palpitations, presyncope or syncope. He denies any PND or orthopnea. He has had no weight gain or edema.  Allergies  Allergen Reactions  . Valsartan     REACTION: passes out    Current Outpatient Prescriptions  Medication Sig Dispense Refill  . carvedilol (COREG) 12.5 MG tablet TAKE 1 TABLET BY MOUTH 2 TIMES A DAY WITH MEALS  180 tablet  2  . co-enzyme Q-10 30 MG capsule Take 30 mg by mouth 3 (three) times daily.        . famotidine (PEPCID) 20 MG tablet One at bedtime  30 tablet  5  . HYDROcodone-acetaminophen (VICODIN) 5-500 MG per tablet Take 1 tablet by mouth every 6 (six) hours as needed for pain.  40 tablet  0  . Multiple Vitamins-Minerals (ALIVE MENS ENERGY PO) Take by mouth daily.      . pantoprazole (PROTONIX) 40 MG tablet Take 1 tablet (40 mg total) by mouth daily. Take 30-60 min before first meal of the day  30 tablet  5  . traMADol (ULTRAM) 50 MG tablet Take 1 tablet (50 mg total) by mouth every 4 (four) hours as needed.  40 tablet  0  . VYTORIN 10-40 MG per tablet TAKE 1 TABLET AT BEDTIME  90 tablet  0   No current facility-administered medications for this visit.    Past Medical History  Diagnosis Date  . Colon polyps   . Diverticulosis of colon   . Chronic systolic heart failure     NICM:  LHC 11/2003: pLAD 25%, oAV CFX 25%, mRCA 25%, EF 40%.  Echo 9/07:  EF 40-50%, mild LVH, mild to moderate MAC, moderate LAE.  MUGA 2005: EF 39%.  Hoter 2006: PAC, sinus tach, NSR.  Nuclear study 2005: no ischemia, apical thinning, EF 45%.  60% EF 2014 echo  . Varicose  vein   . LBBB (left bundle branch block)   . DVT (deep venous thrombosis)   . Hypertension   . Hyperlipidemia   . History of prostate cancer   . Prostate cancer 2008  . Shingles 02/16/2012  . Postinflammatory pulmonary fibrosis     Past Surgical History  Procedure Laterality Date  . Appendectomy    . Cervical laminectomy    . Total hip arthroplasty      bilateral    ROS:  As stated in the HPI and negative for all other systems.  PHYSICAL EXAM BP 110/60  Pulse 84  Ht 5\' 9"  (1.753 m)  Wt 189 lb (85.73 kg)  BMI 27.90 kg/m2 GENERAL:  Well appearing NECK:  No jugular venous distention, waveform within normal limits, carotid upstroke brisk and symmetric, no bruits, no thyromegaly LUNGS:  Mild fine basilar crackles bilaterally CHEST:  Unremarkable HEART:  PMI not displaced or sustained,S1 and S2 within normal limits, no S3, no S4, no clicks, no rubs, no murmurs ABD:  Flat, positive bowel sounds normal in frequency in pitch, no bruits, no rebound, no guarding, no midline pulsatile mass, no hepatomegaly, no splenomegaly EXT:  2 plus pulses throughout, trace ankle edema, no cyanosis no clubbing  EKG:  Sinus rhythm, rate 54, left bundle branch block, left axis deviation, no change from previous. 10/14/2013  ASSESSMENT AND PLAN  DYPSPNEA:  He is followed by Dr. Melvyn Novas.  I would prefer that he continue the current dose of beta blocker unless that there is clear evidence that it is negatively impacting his pulmonary situation.  His BP did improve on the current dose.   CAD:  He has had nonobstructive disease in the past. We will continue management with risk reduction.  HTN:  The blood pressure is at target. No change in medications is indicated. We will continue with therapeutic lifestyle changes (TLC).  RISK REDUCTION:  I would like to check a lipid profile.

## 2013-10-14 NOTE — Patient Instructions (Signed)
Your physician wants you to follow-up in: [place] will receive a reminder letter in the mail two months in advance. If you don't receive a letter, please call our office to schedule the follow-up appointment.

## 2013-11-02 ENCOUNTER — Other Ambulatory Visit: Payer: Self-pay | Admitting: Cardiology

## 2013-11-02 MED ORDER — CARVEDILOL 12.5 MG PO TABS: ORAL_TABLET | ORAL | Status: DC

## 2013-12-21 ENCOUNTER — Ambulatory Visit (INDEPENDENT_AMBULATORY_CARE_PROVIDER_SITE_OTHER): Payer: Medicare Other | Admitting: Internal Medicine

## 2013-12-21 ENCOUNTER — Encounter: Payer: Self-pay | Admitting: Internal Medicine

## 2013-12-21 VITALS — BP 120/80 | HR 66 | Temp 97.6°F | Ht 69.0 in | Wt 190.0 lb

## 2013-12-21 DIAGNOSIS — R0902 Hypoxemia: Secondary | ICD-10-CM

## 2013-12-21 DIAGNOSIS — J8410 Pulmonary fibrosis, unspecified: Secondary | ICD-10-CM

## 2013-12-21 NOTE — Progress Notes (Signed)
Subjective:    Patient ID: [name] [name], male    DOB: [birth year]  MRN: [id number]    Brief patient profile:  73 yowm never smoker eval in PUlmonary around 2006 for ? Reason referred by Dr Elease Hashimoto 05/19/2013 for pulmonary eval for cough x Oct 2014 referred back to pulmonary 05/19/13  with dx of PF ? Onset but CT chest 2005 c/w ILD    History of Present Illness  05/19/2013 1st [place] [facility] office visit/ Ludene Stokke cc indolent onset progressively worse daily non productive cough since Oct 2014 superimposed on doe x inclines x sev years   And assoc with hoarseness. Rarely at hs on cough syrup. Doesn't wake him up but rather p stirring x one half hour.  Tried cough drops no better  - no exp to chemo, amio, no RA  rec Pantoprazole (protonix) 40 mg   Take 30-60 min before first meal of the day and Pepcid 20 mg one bedtime until return to office - this is the best way to tell whether stomach acid is contributing to your problem.   GERD diet  Take delsym two tsp every 12 hours and supplement if needed with  tramadol 50 mg up to 1 every 4 hours to suppress the urge to cough  Once you have eliminated the cough for 3 straight days try reducing the tramadol first,  then the delsym as tolerated.       06/24/2013 f/u ov/Taiyana Kissler re:  PF Chief Complaint  Patient presents with  . Followup with PFT    Pt states no change in DOE. His cough is minimally improved. No new co's today.  Able to able to do recumbent bike level 2 x 30 min daily x 6 months. rec 02 2lpm with exercise. No change rx    12/21/2013 f/u ov/Ontario Pettengill re: pf on ppi/pepcid Chief Complaint  Patient presents with  . Follow-up    Pt states "I feel like I'm breathing a little easier".  No new co's today.    able to farther distance in same time on recumbent bike s 02  18 golf one prior to OV   Problems with inclines walking otherwise can go at slow pace anywhere he wants and not using 02 at all.  Cough is better   No obvious day to day or  daytime variabilty or assoc   cp or chest tightness, subjective wheeze overt sinus or hb symptoms. No unusual exp hx or h/o childhood pna/ asthma or knowledge of premature birth.  Sleeping ok without nocturnal  or early am exacerbation  of respiratory  c/o's or need for noct saba. Also denies any obvious fluctuation of symptoms with weather or environmental changes or other aggravating or alleviating factors except as outlined above   Current Medications, Allergies, Complete Past Medical History, Past Surgical History, Family History, and Social History were reviewed in [employer] record.  ROS  The following are not active complaints unless bolded sore throat, dysphagia, dental problems, itching, sneezing,  nasal congestion or excess/ purulent secretions, ear ache,   fever, chills, sweats, unintended wt loss, pleuritic or exertional cp, hemoptysis,  orthopnea pnd or leg swelling, presyncope, palpitations, heartburn, abdominal pain, anorexia, nausea, vomiting, diarrhea  or change in bowel or urinary habits, change in stools or urine, dysuria,hematuria,  rash, arthralgias, visual complaints, headache, numbness weakness or ataxia or problems with walking or coordination,  change in mood/affect or memory.  Objective:   Physical Exam  amb mild  hoarse wm   with  Less freq throat clearing   06/24/2013          187   Vs  12/21/2013 190  Wt Readings from Last 3 Encounters:  05/19/13 191 lb (86.637 kg)  05/06/13 194 lb (87.998 kg)  10/06/12 196 lb (88.905 kg)      HEENT: nl dentition, turbinates, and orophanx. Nl external ear canals without cough reflex   NECK :  without JVD/Nodes/TM/ nl carotid upstrokes bilaterally   LUNGS: no acc muscle use, clear to A and P bilaterally with  Min Cough @ end   insp with a few insp crackles bilaterally    CV:  RRR  no s3 or murmur or increase in P2, no edema   ABD:  soft and nontender with nl excursion in  the supine position. No bruits or organomegaly, bowel sounds nl  MS:  warm without deformities, calf tenderness, cyanosis or clubbing  SKIN: warm and dry without lesions       CXR  05/19/2013 :  Allowing for differences in radiographic technique there has not been significant interval change in the appearance of the lungs. There remain bilaterally increased interstitial markings which suggests pulmonary fibrosis. Certainly superimposed acute bronchitis cannot be excluded. There is no alveolar pneumonia nor evidence of CHF.       Assessment & Plan:

## 2013-12-21 NOTE — Patient Instructions (Addendum)
Use 02 with exercise at 2lpm - walk at slower pace if not using 02 if need to walk more than 200 ft  Please schedule a follow up office visit in 6 weeks, call sooner if needed with pfts on return

## 2013-12-22 NOTE — Assessment & Plan Note (Addendum)
-  present on CT chest 2005 - PFT's 06/24/2013 VC 2.08 (53%) without airflow obst and DLOC 34 corrects to 71 - 06/24/2013  desats ambulating corrected on 2lpm (see ex hypoxemia) - collagen vasc screen sent  06/24/2013 >  ANA Pos 1:80 speckled , ESR 36  His main symptom, cough, is better on GERD rx , and it's not clear this is a progressive dz as his ex tol is actually improving (by his hx and also sats in office)  but beest option is do serial walking sats and lung volumes and if any serial changes then HRCT and consider OFEV if meets IPF criteria

## 2013-12-22 NOTE — Assessment & Plan Note (Signed)
06/24/2013 Patient Saturations on Room Air at Rest = 93% Patient Saturations on [employer] while Ambulating = 83%> Patient Saturations on 2 Liters of oxygen while Ambulating = 92% - 12/22/2013   Walked RA @ fast pace x one lap @ 185 stopped due to  desat to 86%  Options are walk slower or wear 02 at 2lpm esp with exercise, encouraged to start self monitor/ titration   See instructions for specific recommendations which were reviewed directly with the patient who was given a copy with highlighter outlining the key components.

## 2014-01-13 ENCOUNTER — Telehealth: Payer: Self-pay | Admitting: Internal Medicine

## 2014-01-13 NOTE — Telephone Encounter (Signed)
Pt needs a re-fill on VYTORIN 10-40 MG per tablet Pt is scheduled to est with Dr. Ronnald Ramp on 01/28/14.  Can you please assist??

## 2014-01-14 ENCOUNTER — Other Ambulatory Visit: Payer: Self-pay | Admitting: Internal Medicine

## 2014-01-14 MED ORDER — EZETIMIBE-SIMVASTATIN 10-40 MG PO TABS: 1.0000 | ORAL_TABLET | Freq: Every day | ORAL | Status: DC

## 2014-01-14 NOTE — Telephone Encounter (Signed)
yes

## 2014-01-28 ENCOUNTER — Ambulatory Visit: Payer: Medicare Other | Admitting: Internal Medicine

## 2014-02-01 ENCOUNTER — Ambulatory Visit (INDEPENDENT_AMBULATORY_CARE_PROVIDER_SITE_OTHER): Payer: Medicare Other | Admitting: Internal Medicine

## 2014-02-01 ENCOUNTER — Encounter: Payer: Self-pay | Admitting: Internal Medicine

## 2014-02-01 VITALS — BP 110/62 | HR 60 | Temp 97.7°F | Ht 69.0 in | Wt 188.0 lb

## 2014-02-01 DIAGNOSIS — R0902 Hypoxemia: Secondary | ICD-10-CM

## 2014-02-01 DIAGNOSIS — J8410 Pulmonary fibrosis, unspecified: Secondary | ICD-10-CM

## 2014-02-01 LAB — PULMONARY FUNCTION TEST
DL/VA % pred: 61 %
DL/VA: 2.77 ml/min/mmHg/L
DLCO UNC % PRED: 29 %
DLCO unc: 9.05 ml/min/mmHg
FEF 25-75 PRE: 1.45 L/s
FEF 25-75 Post: 1.19 L/sec
FEF2575-%Change-Post: -17 %
FEF2575-%Pred-Post: 64 %
FEF2575-%Pred-Pre: 78 %
FEV1-%CHANGE-POST: 0 %
FEV1-%Pred-Post: 61 %
FEV1-%Pred-Pre: 61 %
FEV1-Post: 1.65 L
FEV1-Pre: 1.66 L
FEV1FVC-%Change-Post: 3 %
FEV1FVC-%PRED-PRE: 105 %
FEV6-%Change-Post: 0 %
FEV6-%PRED-POST: 58 %
FEV6-%Pred-Pre: 59 %
FEV6-PRE: 2.11 L
FEV6-Post: 2.09 L
FEV6FVC-%CHANGE-POST: -1 %
FEV6FVC-%PRED-PRE: 107 %
FEV6FVC-%Pred-Post: 105 %
FVC-%Change-Post: -3 %
FVC-%Pred-Post: 56 %
FVC-%Pred-Pre: 57 %
FVC-POST: 2.14 L
FVC-Pre: 2.21 L
POST FEV6/FVC RATIO: 98 %
Post FEV1/FVC ratio: 77 %
Pre FEV1/FVC ratio: 75 %
Pre FEV6/FVC Ratio: 100 %
RV % pred: 63 %
RV: 1.66 L
TLC % PRED: 53 %
TLC: 3.66 L

## 2014-02-01 NOTE — Progress Notes (Signed)
PFT done today. 

## 2014-02-01 NOTE — Patient Instructions (Addendum)
GERD (REFLUX)  is an extremely common cause of respiratory symptoms, many times with no significant heartburn at all.    It can be treated with medication, but also with lifestyle changes including avoidance of late meals, excessive alcohol, smoking cessation, and avoid fatty foods, chocolate, peppermint, colas, red wine, and acidic juices such as orange juice.  NO MINT OR MENTHOL PRODUCTS SO NO COUGH DROPS  USE SUGARLESS CANDY INSTEAD (jolley ranchers or Stover's)  NO OIL BASED VITAMINS - use powdered substitutes.    Stay on acid suppression as you are  Please schedule a follow up visit in 6 months but call sooner if needed

## 2014-02-01 NOTE — Progress Notes (Signed)
Subjective:    Patient ID: [name] [name], male    DOB: June 19, 1930  MRN: [id number]    Brief patient profile:  33 yowm never smoker eval in PUlmonary around 2006 for ? Reason referred by Dr Elease Hashimoto 05/19/2013 for pulmonary eval for cough x Oct 2014 referred back to pulmonary 05/19/13  with dx of PF ? Onset but CT chest 2005 c/w ILD    History of Present Illness  05/19/2013 1st  Pulmonary office visit/ Wert cc indolent onset progressively worse daily non productive cough since Oct 2014 superimposed on doe x inclines x sev years   And assoc with hoarseness. Rarely at hs on cough syrup. Doesn't wake him up but rather p stirring x one half hour.  Tried cough drops no better  - no exp to chemo, amio, no RA  rec Pantoprazole (protonix) 40 mg   Take 30-60 min before first meal of the day and Pepcid 20 mg one bedtime until return to office - this is the best way to tell whether stomach acid is contributing to your problem.   GERD diet  Take delsym two tsp every 12 hours and supplement if needed with  tramadol 50 mg up to 1 every 4 hours to suppress the urge to cough  Once you have eliminated the cough for 3 straight days try reducing the tramadol first,  then the delsym as tolerated.       06/24/2013 f/u ov/Wert re:  PF Chief Complaint  Patient presents with  . Followup with PFT    Pt states no change in DOE. His cough is minimally improved. No new co's today.  Able to able to do recumbent bike level 2 x 30 min daily x 6 months. rec 02 2lpm with exercise. No change rx    12/21/2013 f/u ov/Wert re: pf on ppi/pepcid Chief Complaint  Patient presents with  . Follow-up    Pt states "I feel like I'm breathing a little easier".  No new co's today.    able to farther distance in same time on recumbent bike s 02  18 golf one prior to OV   Problems with inclines walking otherwise can go at slow pace anywhere he wants and not using 02 at all.  Cough is better  rec Use 02 with exercise at  2lpm - walk at slower pace if not using 02 if need to walk more than 200 ft   02/01/2014 f/u ov/Wert re: pf / ppi/ pepcid Chief Complaint  Patient presents with  . Follow-up    PFT done today. Breathing doing well. Played 18 holes gold 1 day ago without much trouble.     Not using 02 much at all except on treadmill/ stationery bike   No obvious day to day or daytime variabilty or assoc cough   cp or chest tightness, subjective wheeze overt sinus or hb symptoms. No unusual exp hx or h/o childhood pna/ asthma or knowledge of premature birth.  Sleeping ok without nocturnal  or early am exacerbation  of respiratory  c/o's or need for noct saba. Also denies any obvious fluctuation of symptoms with weather or environmental changes or other aggravating or alleviating factors except as outlined above   Current Medications, Allergies, Complete Past Medical History, Past Surgical History, Family History, and Social History were reviewed in [employer] record.  ROS  The following are not active complaints unless bolded sore throat, dysphagia, dental problems, itching, sneezing,  nasal congestion or excess/  purulent secretions, ear ache,   fever, chills, sweats, unintended wt loss, pleuritic or exertional cp, hemoptysis,  orthopnea pnd or leg swelling, presyncope, palpitations, heartburn, abdominal pain, anorexia, nausea, vomiting, diarrhea  or change in bowel or urinary habits, change in stools or urine, dysuria,hematuria,  rash, arthralgias, visual complaints, headache, numbness weakness or ataxia or problems with walking or coordination,  change in mood/affect or memory.                         Objective:   Physical Exam  amb mild  hoarse wm    No longer constant throat clearing   06/24/2013          187   Vs  12/21/2013 190 vs 02/01/2014 188  Wt Readings from Last 3 Encounters:  05/19/13 191 lb (86.637 kg)  05/06/13 194 lb (87.998 kg)  10/06/12 196 lb (88.905 kg)       HEENT: nl dentition, turbinates, and orophanx. Nl external ear canals without cough reflex   NECK :  without JVD/Nodes/TM/ nl carotid upstrokes bilaterally   LUNGS: no acc muscle use, clear to A and P bilaterally with  Min cough at end insp   CV:  RRR  no s3 or murmur or increase in P2, no edema   ABD:  soft and nontender with nl excursion in the supine position. No bruits or organomegaly, bowel sounds nl  MS:  warm without deformities, calf tenderness, cyanosis or clubbing  SKIN: warm and dry without lesions       CXR  05/19/2013 :  Allowing for differences in radiographic technique there has not been significant interval change in the appearance of the lungs. There remain bilaterally increased interstitial markings which suggests pulmonary fibrosis. Certainly superimposed acute bronchitis cannot be excluded. There is no alveolar pneumonia nor evidence of CHF.       Assessment & Plan:

## 2014-02-02 NOTE — Assessment & Plan Note (Signed)
06/24/2013 Patient Saturations on Room Air at Rest = 93% Patient Saturations on Room Air while Ambulating = 83%> Patient Saturations on 2 Liters of oxygen while Ambulating = 92% - 12/22/2013   Walked RA @ fast pace x one lap @ 185 stopped due to  desat to 86%  Advised to use 2lpm with exertion

## 2014-02-02 NOTE — Assessment & Plan Note (Signed)
-  present on CT chest 2005 - PFT's 06/24/2013 VC 2.08 (53%) without airflow obst and DLOC 34 corrects to 71 - PFTs 02/01/2014 VC 2.0 (52%) withouth airflow obst and DLCO 29% corrects to 61  - 06/24/2013  desats ambulating corrected on 2lpm (see ex hypoxemia) - collagen vasc screen sent  06/24/2013 >  ANA Pos 1:80 speckled , ESR 36  Present x 10 y with very little evidence of progression and most of his symptoms addressed with rx for gerd  Use of PPI is associated with improved survival time and with decreased radiologic fibrosis per King's study published in AJRCCM vol 184 p1390.  Dec 2011  This may not be cause and effect, but given how universally unhelpful all the otherstudy drugs have been for pf,   rec continue  rx ppi / diet/ lifestyle modification.   

## 2014-02-17 ENCOUNTER — Other Ambulatory Visit (INDEPENDENT_AMBULATORY_CARE_PROVIDER_SITE_OTHER): Payer: Medicare Other

## 2014-02-17 ENCOUNTER — Ambulatory Visit (INDEPENDENT_AMBULATORY_CARE_PROVIDER_SITE_OTHER): Payer: Medicare Other | Admitting: Internal Medicine

## 2014-02-17 ENCOUNTER — Encounter: Payer: Self-pay | Admitting: Internal Medicine

## 2014-02-17 ENCOUNTER — Other Ambulatory Visit: Payer: Medicare Other

## 2014-02-17 VITALS — BP 112/64 | HR 69 | Temp 97.8°F | Resp 16 | Ht 69.0 in | Wt 188.0 lb

## 2014-02-17 DIAGNOSIS — I5020 Unspecified systolic (congestive) heart failure: Secondary | ICD-10-CM

## 2014-02-17 DIAGNOSIS — I447 Left bundle-branch block, unspecified: Secondary | ICD-10-CM

## 2014-02-17 DIAGNOSIS — I10 Essential (primary) hypertension: Secondary | ICD-10-CM

## 2014-02-17 DIAGNOSIS — E785 Hyperlipidemia, unspecified: Secondary | ICD-10-CM

## 2014-02-17 LAB — URINALYSIS, ROUTINE W REFLEX MICROSCOPIC
BILIRUBIN URINE: NEGATIVE
HGB URINE DIPSTICK: NEGATIVE
KETONES UR: NEGATIVE
LEUKOCYTES UA: NEGATIVE
Nitrite: NEGATIVE
PH: 5.5 (ref 5.0–8.0)
Specific Gravity, Urine: 1.025 (ref 1.000–1.030)
Total Protein, Urine: NEGATIVE
Urine Glucose: NEGATIVE
Urobilinogen, UA: 0.2 (ref 0.0–1.0)

## 2014-02-17 LAB — CBC WITH DIFFERENTIAL/PLATELET
BASOS ABS: 0 10*3/uL (ref 0.0–0.1)
Basophils Relative: 0.6 % (ref 0.0–3.0)
Eosinophils Absolute: 0.3 10*3/uL (ref 0.0–0.7)
Eosinophils Relative: 3.5 % (ref 0.0–5.0)
HCT: 42.4 % (ref 39.0–52.0)
Hemoglobin: 14.3 g/dL (ref 13.0–17.0)
Lymphocytes Relative: 22.8 % (ref 12.0–46.0)
Lymphs Abs: 1.8 10*3/uL (ref 0.7–4.0)
MCHC: 33.7 g/dL (ref 30.0–36.0)
MCV: 93.7 fl (ref 78.0–100.0)
MONO ABS: 0.7 10*3/uL (ref 0.1–1.0)
Monocytes Relative: 9.4 % (ref 3.0–12.0)
NEUTROS PCT: 63.7 % (ref 43.0–77.0)
Neutro Abs: 4.9 10*3/uL (ref 1.4–7.7)
PLATELETS: 247 10*3/uL (ref 150.0–400.0)
RBC: 4.53 Mil/uL (ref 4.22–5.81)
RDW: 13.2 % (ref 11.5–15.5)
WBC: 7.7 10*3/uL (ref 4.0–10.5)

## 2014-02-17 LAB — COMPREHENSIVE METABOLIC PANEL
ALBUMIN: 3.6 g/dL (ref 3.5–5.2)
ALT: 27 U/L (ref 0–53)
AST: 33 U/L (ref 0–37)
Alkaline Phosphatase: 55 U/L (ref 39–117)
BUN: 24 mg/dL — ABNORMAL HIGH (ref 6–23)
CO2: 26 mEq/L (ref 19–32)
Calcium: 9.6 mg/dL (ref 8.4–10.5)
Chloride: 103 mEq/L (ref 96–112)
Creatinine, Ser: 1 mg/dL (ref 0.4–1.5)
GFR: 75.36 mL/min (ref >60.00)
Glucose, Bld: 94 mg/dL (ref 70–99)
POTASSIUM: 4.5 meq/L (ref 3.5–5.1)
SODIUM: 137 meq/L (ref 135–145)
TOTAL PROTEIN: 8.4 g/dL — AB (ref 6.0–8.3)
Total Bilirubin: 0.9 mg/dL (ref 0.2–1.2)

## 2014-02-17 LAB — TSH: TSH: 3.32 u[IU]/mL (ref 0.35–4.50)

## 2014-02-17 LAB — LIPID PANEL
CHOLESTEROL: 152 mg/dL (ref 0–200)
HDL: 67.2 mg/dL (ref >39.00)
LDL CALC: 64 mg/dL (ref 0–99)
NONHDL: 84.8
Total CHOL/HDL Ratio: 2
Triglycerides: 105 mg/dL (ref 0.0–149.0)
VLDL: 21 mg/dL (ref 0.0–40.0)

## 2014-02-17 NOTE — Progress Notes (Signed)
Pre visit review using our clinic review tool, if applicable. No additional management support is needed unless otherwise documented below in the visit note. 

## 2014-02-17 NOTE — Assessment & Plan Note (Signed)
His BP is well controlled 

## 2014-02-17 NOTE — Patient Instructions (Signed)

## 2014-02-17 NOTE — Progress Notes (Signed)
Subjective:    Patient ID: [name] [name], male    DOB: 1930/11/08, 80 y.o.   MRN: [id number]  Hyperlipidemia This is a chronic problem. The current episode started more than 1 year ago. The problem is controlled. Recent lipid tests were reviewed and are variable. [name] [name] has no history of chronic renal disease, diabetes, hypothyroidism, liver disease, obesity or nephrotic syndrome. There are no known factors aggravating his hyperlipidemia. Associated symptoms include shortness of breath (chronic, unchanged). Pertinent negatives include no chest pain, focal sensory loss, focal weakness, leg pain or myalgias. Current antihyperlipidemic treatment includes statins. The current treatment provides significant improvement of lipids. There are no compliance problems.       Review of Systems  Constitutional: Negative.  Negative for fever, chills, diaphoresis, appetite change and fatigue.  HENT: Negative.   Eyes: Negative.   Respiratory: Positive for shortness of breath (chronic, unchanged). Negative for cough, choking, chest tightness and stridor.   Cardiovascular: Negative.  Negative for chest pain and palpitations.  Gastrointestinal: Negative.  Negative for nausea, abdominal pain, diarrhea, constipation and blood in stool.  Endocrine: Negative.   Genitourinary: Negative.  Negative for difficulty urinating.  Musculoskeletal: Negative.  Negative for arthralgias, back pain, joint swelling, myalgias and neck pain.  Skin: Negative.   Allergic/Immunologic: Negative.   Neurological: Negative.  Negative for dizziness, tremors, focal weakness, syncope, light-headedness and numbness.  Hematological: Negative.  Negative for adenopathy. Does not bruise/bleed easily.  Psychiatric/Behavioral: Negative.        Objective:   Physical Exam  Vitals reviewed. Constitutional: [name] [name] is oriented to person, place, and time. [name] [name] appears well-developed and well-nourished. No distress.  HENT:  Head: Normocephalic and  atraumatic.  Nose: Nose normal.  Mouth/Throat: Oropharynx is clear and moist. No oropharyngeal exudate.  Eyes: Conjunctivae are normal. Right eye exhibits no discharge. Left eye exhibits no discharge. No scleral icterus.  Neck: Normal range of motion. Neck supple. No JVD present. No tracheal deviation present. No thyromegaly present.  Cardiovascular: Normal rate, regular rhythm, normal heart sounds and intact distal pulses.  Exam reveals no gallop and no friction rub.   No murmur heard. Pulmonary/Chest: Effort normal. No accessory muscle usage or stridor. Not tachypneic. No respiratory distress. [name] [name] has no decreased breath sounds. [name] [name] has no wheezes. [name] [name] has no rhonchi. [name] [name] has rales (dry,carckly rales in both bases) in the right lower field and the left lower field. [name] [name] exhibits no tenderness.  Abdominal: Soft. Bowel sounds are normal. [name] [name] exhibits no distension and no mass. There is no tenderness. There is no rebound and no guarding.  Musculoskeletal: Normal range of motion. [name] [name] exhibits no edema and no tenderness.  Lymphadenopathy:    [name] [name] has no cervical adenopathy.  Neurological: [name] [name] is oriented to person, place, and time.  Skin: Skin is warm and dry. No rash noted. [name] [name] is not diaphoretic. No erythema. No pallor.     Lab Results  Component Value Date   WBC 6.8 07/31/2012   HGB 13.9 07/31/2012   HCT 41.2 07/31/2012   PLT 217.0 07/31/2012   GLUCOSE 89 07/31/2012   CHOL 156 07/31/2012   TRIG 95.0 07/31/2012   HDL 55.90 07/31/2012   LDLDIRECT 181.0 09/29/2007   LDLCALC 81 07/31/2012   ALT 33 07/31/2012   AST 35 07/31/2012   NA 134* 07/31/2012   K 4.7 07/31/2012   CL 100 07/31/2012   CREATININE 1.0 07/31/2012   BUN 19 07/31/2012   CO2 27 07/31/2012   TSH 2.17 07/31/2012  PSA 0.07* 08/16/2009   INR 2.6* 03/26/2008       Assessment & Plan:

## 2014-02-17 NOTE — Assessment & Plan Note (Signed)
He is doing well on Vytorin Will recheck his FLP CMP and TSH today

## 2014-02-17 NOTE — Assessment & Plan Note (Signed)
He has no s/s related to this Will follow for now

## 2014-02-18 ENCOUNTER — Telehealth: Payer: Self-pay | Admitting: Internal Medicine

## 2014-02-18 NOTE — Telephone Encounter (Signed)
emmi emailed °

## 2014-02-25 ENCOUNTER — Other Ambulatory Visit: Payer: Self-pay | Admitting: Internal Medicine

## 2014-02-25 MED ORDER — PANTOPRAZOLE SODIUM 40 MG PO TBEC: 40.0000 mg | DELAYED_RELEASE_TABLET | Freq: Every day | ORAL | Status: DC

## 2014-02-25 MED ORDER — FAMOTIDINE 20 MG PO TABS: ORAL_TABLET | ORAL | Status: DC

## 2014-03-01 ENCOUNTER — Other Ambulatory Visit: Payer: Self-pay

## 2014-03-01 MED ORDER — EZETIMIBE-SIMVASTATIN 10-40 MG PO TABS: 1.0000 | ORAL_TABLET | Freq: Every day | ORAL | Status: DC

## 2014-03-08 ENCOUNTER — Telehealth: Payer: Self-pay | Admitting: Internal Medicine

## 2014-03-08 MED ORDER — FAMOTIDINE 20 MG PO TABS: ORAL_TABLET | ORAL | Status: DC

## 2014-03-08 MED ORDER — PANTOPRAZOLE SODIUM 40 MG PO TBEC: 40.0000 mg | DELAYED_RELEASE_TABLET | Freq: Every day | ORAL | Status: DC

## 2014-03-08 NOTE — Telephone Encounter (Signed)
lmtcb for pt. Medications were refilled on 02/25/14 to local pharmacy. Will need to make sure pt is aware and [facility]

## 2014-03-08 NOTE — Telephone Encounter (Signed)
Spoke with the pt and [facility] He needed an rx sent to local pharmacy to last until he received mail order. Rx sent. [place] Bing, CMA

## 2014-05-03 ENCOUNTER — Telehealth: Payer: Self-pay | Admitting: Pulmonary Disease

## 2014-05-03 NOTE — Telephone Encounter (Signed)
Pt states that he has moved to [place] and is without oxygen.  Pt states he had [facility] pick his up prior to moving.  Pt is in [place] for the next two weeks then will be moving into new home in [place].  I spoke with Maudie Mercury at [facility] and she states that she will call their offices in [place] to see about transferring services for pt.  She will call pt to discuss this with him.

## 2014-07-27 ENCOUNTER — Other Ambulatory Visit: Payer: Self-pay | Admitting: Internal Medicine

## 2014-08-22 ENCOUNTER — Ambulatory Visit: Payer: Medicare Other | Admitting: Internal Medicine

## 2014-08-22 DIAGNOSIS — Z0289 Encounter for other administrative examinations: Secondary | ICD-10-CM

## 2014-09-19 ENCOUNTER — Other Ambulatory Visit: Payer: Self-pay | Admitting: Cardiology

## 2014-10-17 ENCOUNTER — Other Ambulatory Visit: Payer: Self-pay

## 2014-10-24 ENCOUNTER — Other Ambulatory Visit: Payer: Self-pay | Admitting: Internal Medicine

## 2014-12-19 ENCOUNTER — Other Ambulatory Visit: Payer: Self-pay | Admitting: Cardiology

## 2014-12-21 ENCOUNTER — Encounter: Payer: Self-pay | Admitting: Internal Medicine

## 2015-03-18 ENCOUNTER — Other Ambulatory Visit: Payer: Self-pay | Admitting: Cardiology

## 2015-03-20 NOTE — Telephone Encounter (Signed)
REFILL 

## 2015-04-16 ENCOUNTER — Other Ambulatory Visit: Payer: Self-pay | Admitting: Internal Medicine

## 2015-09-12 ENCOUNTER — Other Ambulatory Visit: Payer: Self-pay | Admitting: Cardiology

## 2015-09-12 NOTE — Telephone Encounter (Signed)
Rx(s) sent to pharmacy electronically.  

## 2015-10-13 ENCOUNTER — Other Ambulatory Visit: Payer: Self-pay | Admitting: Internal Medicine

## 2016-01-16 ENCOUNTER — Other Ambulatory Visit: Payer: Self-pay | Admitting: Cardiology

## 2019-05-24 DEATH — deceased
